# Patient Record
Sex: Female | Born: 1980 | Race: White | Hispanic: No | Marital: Married | Smoking: Never smoker
Health system: Southern US, Community
[De-identification: ages and names within clinical notes are randomized; demographics above are authoritative.]

## PROBLEM LIST (undated history)

## (undated) DIAGNOSIS — K219 Gastro-esophageal reflux disease without esophagitis: Secondary | ICD-10-CM

## (undated) DIAGNOSIS — D649 Anemia, unspecified: Secondary | ICD-10-CM

## (undated) DIAGNOSIS — Z5189 Encounter for other specified aftercare: Secondary | ICD-10-CM

## (undated) DIAGNOSIS — O26649 Intrahepatic cholestasis of pregnancy, unspecified trimester: Secondary | ICD-10-CM

## (undated) DIAGNOSIS — N809 Endometriosis, unspecified: Secondary | ICD-10-CM

## (undated) DIAGNOSIS — F411 Generalized anxiety disorder: Secondary | ICD-10-CM

## (undated) HISTORY — DX: Generalized anxiety disorder: F41.1

## (undated) HISTORY — PX: ABDOMINAL SURGERY: SHX537

## (undated) HISTORY — DX: Gastro-esophageal reflux disease without esophagitis: K21.9

## (undated) HISTORY — DX: Anemia, unspecified: D64.9

## (undated) HISTORY — DX: Endometriosis, unspecified: N80.9

---
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
## 1999-10-23 ENCOUNTER — Emergency Department (HOSPITAL_COMMUNITY): Admission: EM | Admit: 1999-10-23 | Discharge: 1999-10-23 | Payer: Self-pay

## 2006-09-21 ENCOUNTER — Ambulatory Visit (HOSPITAL_COMMUNITY): Admission: RE | Admit: 2006-09-21 | Discharge: 2006-09-21 | Payer: Self-pay | Admitting: *Deleted

## 2006-09-21 ENCOUNTER — Encounter (INDEPENDENT_AMBULATORY_CARE_PROVIDER_SITE_OTHER): Payer: Self-pay | Admitting: Specialist

## 2009-02-03 ENCOUNTER — Encounter: Admission: RE | Admit: 2009-02-03 | Discharge: 2009-02-03 | Payer: Self-pay | Admitting: Surgery

## 2010-12-09 NOTE — Op Note (Signed)
NAMEELON, LOMELI                ACCOUNT NO.:  [id number]   MEDICAL RECORD NO.:  [id number]          PATIENT TYPE:  AMB   LOCATION:  DAY                          FACILITY:  [facility]   PHYSICIAN:  Alfonse Ras, MD   DATE OF BIRTH:  01-27-1981   DATE OF PROCEDURE:  09/21/2006  DATE OF DISCHARGE:                               OPERATIVE REPORT   PREOPERATIVE DIAGNOSIS:  Right lower quadrant abdominal wall mass.   POSTOPERATIVE DIAGNOSIS:  Right lower quadrant abdominal wall mass.   PROCEDURE:  Excisional biopsy of right lower quadrant abdominal wall  mass.   SURGEON:  Alfonse Ras, MD   ANESTHESIA:  General.   DESCRIPTION:  The patient was taken to the operating room and placed in  the supine position after adequate anesthesia was induced.  Using  laryngeal mask, the right lower quadrant was prepped and draped in a  normal sterile fashion.  Using an oblique incision overlying the mass.  I dissected down through the subcutaneous tissue, on a very thin  patient.  The mass was mobilized off the musculature; however, the  external oblique fascia was divided.  After excising the mass in its  entirety.  The external oblique fascia was closed with interrupted #1  Novofil's.   Skin was closed with subcuticular 4-0 Monocryl.  Steri-Strips and  sterile dressings were applied.  The patient tolerated the procedure  well; and went to PACU in good condition.  Of note, the tissues were  injected with 0.5% Marcaine at the conclusion of the operation.      Alfonse Ras, MD  Electronically Signed     KRE/MEDQ  D:  09/21/2006  T:  09/21/2006  Job:  [id number]

## 2011-06-26 ENCOUNTER — Emergency Department (HOSPITAL_BASED_OUTPATIENT_CLINIC_OR_DEPARTMENT_OTHER)
Admission: EM | Admit: 2011-06-26 | Discharge: 2011-06-26 | Disposition: A | Discharge status: Home / Self Care | Payer: Self-pay | Attending: Emergency Medicine | Admitting: Emergency Medicine

## 2011-06-26 ENCOUNTER — Emergency Department (INDEPENDENT_AMBULATORY_CARE_PROVIDER_SITE_OTHER): Payer: Self-pay

## 2011-06-26 ENCOUNTER — Encounter: Payer: Self-pay | Admitting: *Deleted

## 2011-06-26 DIAGNOSIS — R1900 Intra-abdominal and pelvic swelling, mass and lump, unspecified site: Secondary | ICD-10-CM | POA: Insufficient documentation

## 2011-06-26 DIAGNOSIS — R109 Unspecified abdominal pain: Secondary | ICD-10-CM

## 2011-06-26 DIAGNOSIS — R222 Localized swelling, mass and lump, trunk: Secondary | ICD-10-CM

## 2011-06-26 DIAGNOSIS — K319 Disease of stomach and duodenum, unspecified: Secondary | ICD-10-CM

## 2011-06-26 DIAGNOSIS — R209 Unspecified disturbances of skin sensation: Secondary | ICD-10-CM | POA: Insufficient documentation

## 2011-06-26 DIAGNOSIS — N809 Endometriosis, unspecified: Secondary | ICD-10-CM

## 2011-06-26 LAB — CBC
HCT: 33.9 % — ABNORMAL LOW (ref 36.0–46.0)
Hemoglobin: 11.5 g/dL — ABNORMAL LOW (ref 12.0–15.0)
MCH: 30.3 pg (ref 26.0–34.0)
MCHC: 33.9 g/dL (ref 30.0–36.0)
MCV: 89.2 fL (ref 78.0–100.0)
Platelets: 207 10*3/uL (ref 150–400)
RBC: 3.8 MIL/uL — ABNORMAL LOW (ref 3.87–5.11)
RDW: 12.2 % (ref 11.5–15.5)
WBC: 7.3 10*3/uL (ref 4.0–10.5)

## 2011-06-26 LAB — DIFFERENTIAL
Basophils Absolute: 0 10*3/uL (ref 0.0–0.1)
Basophils Relative: 0 % (ref 0–1)
Eosinophils Absolute: 0 10*3/uL (ref 0.0–0.7)
Eosinophils Relative: 0 % (ref 0–5)
Lymphocytes Relative: 17 % (ref 12–46)
Lymphs Abs: 1.2 10*3/uL (ref 0.7–4.0)
Monocytes Absolute: 0.4 10*3/uL (ref 0.1–1.0)
Monocytes Relative: 5 % (ref 3–12)
Neutro Abs: 5.7 10*3/uL (ref 1.7–7.7)
Neutrophils Relative %: 78 % — ABNORMAL HIGH (ref 43–77)

## 2011-06-26 LAB — URINALYSIS, ROUTINE W REFLEX MICROSCOPIC
Bilirubin Urine: NEGATIVE
Glucose, UA: NEGATIVE mg/dL
Hgb urine dipstick: NEGATIVE
Ketones, ur: NEGATIVE mg/dL
Nitrite: NEGATIVE
Protein, ur: NEGATIVE mg/dL
Specific Gravity, Urine: 1.021 (ref 1.005–1.030)
Urobilinogen, UA: 1 mg/dL (ref 0.0–1.0)
pH: 6 (ref 5.0–8.0)

## 2011-06-26 LAB — BASIC METABOLIC PANEL
BUN: 12 mg/dL (ref 6–23)
CO2: 26 mEq/L (ref 19–32)
Calcium: 9.5 mg/dL (ref 8.4–10.5)
Chloride: 106 mEq/L (ref 96–112)
Creatinine, Ser: 0.6 mg/dL (ref 0.50–1.10)
GFR calc Af Amer: >90 mL/min (ref >90)
GFR calc non Af Amer: >90 mL/min (ref >90)
Glucose, Bld: 102 mg/dL — ABNORMAL HIGH (ref 70–99)
Potassium: 3.8 mEq/L (ref 3.5–5.1)
Sodium: 140 mEq/L (ref 135–145)

## 2011-06-26 LAB — URINE MICROSCOPIC-ADD ON: RBC / HPF: NONE SEEN RBC/hpf (ref <3)

## 2011-06-26 LAB — PREGNANCY, URINE: Preg Test, Ur: NEGATIVE

## 2011-06-26 MED ORDER — HYDROCODONE-ACETAMINOPHEN 5-325 MG PO TABS: 2.0000 | ORAL_TABLET | ORAL | Status: AC | PRN

## 2011-06-26 NOTE — ED Notes (Signed)
Pt taken to US

## 2011-06-26 NOTE — ED Provider Notes (Signed)
History     CSN: [id number] Arrival date & time: 06/26/2011 12:56 PM   First MD Initiated Contact with Patient 06/26/11 1355      Chief Complaint  Patient presents with  . Abdominal Pain    (Consider location/radiation/quality/duration/timing/severity/associated sxs/prior treatment) HPI Abdominal pain began one week ago.  S/p tumor removal (fatty mass) patient states it was just under skin and told it was benign- removed by Dr. Simona Huh.  Second tumor diagnosed but decided not to remove.  Pain is in that area.  Pain increases with palpation, coughing, movement.  Patient is off bcp for three months.  LMP 2 weeks ago normal.  No change in bowel, no nausea or vomiting.  Patient has had increased recent physical acitivity.   History reviewed. No pertinent past medical history.  Past Surgical History  Procedure Date  . Abdominal surgery     History reviewed. No pertinent family history.  History  Substance Use Topics  . Smoking status: Never Smoker   . Smokeless tobacco: Not on file  . Alcohol Use: No    OB History    Grav Para Term Preterm Abortions TAB SAB Ect Mult Living                  Review of Systems  All other systems reviewed and are negative.    Allergies  Review of patient's allergies indicates not on file.  Home Medications  No current outpatient prescriptions on file.  BP 122/70  Pulse 67  Temp 98.9 F (37.2 C)  Resp 16  Ht 5\' 1"  (1.549 m)  Wt 98 lb (44.453 kg)  BMI 18.52 kg/m2  SpO2 100%  LMP 06/24/2011  Physical Exam  Nursing note and vitals reviewed. Constitutional: She is oriented to person, place, and time. She appears well-developed and well-nourished.  HENT:  Head: Normocephalic and atraumatic.  Eyes: Conjunctivae and EOM are normal. Pupils are equal, round, and reactive to light.  Neck: Normal range of motion. Neck supple.  Cardiovascular: Normal rate, regular rhythm, normal heart sounds and intact distal pulses.   Pulmonary/Chest:  Effort normal and breath sounds normal.  Abdominal: Soft. Bowel sounds are normal. There is tenderness.       Tenderness suprapubic area  Musculoskeletal: Normal range of motion.  Neurological: She is alert and oriented to person, place, and time.  Skin: Skin is warm and dry.  Psychiatric: She has a normal mood and affect. Her behavior is normal. Judgment and thought content normal.    ED Course  Procedures (including critical care time)  Labs Reviewed  URINALYSIS, ROUTINE W REFLEX MICROSCOPIC - Abnormal; Notable for the following:    APPearance CLOUDY (*)    Leukocytes, UA TRACE (*)    All other components within normal limits  URINE MICROSCOPIC-ADD ON - Abnormal; Notable for the following:    Squamous Epithelial / LPF FEW (*)    Bacteria, UA MANY (*)    All other components within normal limits  PREGNANCY, URINE   No results found.   No diagnosis found.    Patient to have ultrasound and will be referred back to general surgery if nonacute.          Hilario Quarry, MD 06/26/11 [phone]

## 2011-06-26 NOTE — ED Notes (Signed)
I assumed care of this patient from Dr. Rosalia Hammers. Her abdominal ultrasound shows the anterior abdominal wall mass lesion has increased in size  since last measured in 2010.  Patient is stable in no distress. Abdomen is soft with minimal suprapubic tenderness. Referred to surgery for elective repair as an outpatient.  Glynn Octave, MD 06/26/11 [phone]

## 2011-06-26 NOTE — ED Notes (Signed)
Pt c/o lower right abd pain x 1 week , denies n/v/d

## 2011-06-26 NOTE — ED Notes (Signed)
Pt returned from US

## 2011-07-13 ENCOUNTER — Encounter (INDEPENDENT_AMBULATORY_CARE_PROVIDER_SITE_OTHER): Payer: Self-pay | Admitting: Surgery

## 2011-08-21 ENCOUNTER — Encounter (INDEPENDENT_AMBULATORY_CARE_PROVIDER_SITE_OTHER): Payer: Self-pay | Admitting: Surgery

## 2014-12-09 ENCOUNTER — Other Ambulatory Visit (HOSPITAL_COMMUNITY): Payer: Self-pay | Admitting: Gynecology

## 2014-12-09 DIAGNOSIS — Z3141 Encounter for fertility testing: Secondary | ICD-10-CM

## 2014-12-15 ENCOUNTER — Ambulatory Visit (HOSPITAL_COMMUNITY)
Admission: RE | Admit: 2014-12-15 | Discharge: 2014-12-15 | Disposition: A | Discharge status: Home / Self Care | Payer: BC Managed Care – PPO | Source: Ambulatory Visit | Attending: Gynecology | Admitting: Gynecology

## 2014-12-15 DIAGNOSIS — N979 Female infertility, unspecified: Secondary | ICD-10-CM | POA: Diagnosis not present

## 2014-12-15 DIAGNOSIS — Z3141 Encounter for fertility testing: Secondary | ICD-10-CM

## 2014-12-15 MED ORDER — IOHEXOL 300 MG/ML  SOLN
30.0000 mL | Freq: Once | INTRAMUSCULAR | Status: AC | PRN
  Administered 2014-12-15: 30 mL

## 2015-03-01 ENCOUNTER — Encounter (HOSPITAL_COMMUNITY): Payer: Self-pay | Admitting: Emergency Medicine

## 2015-03-01 ENCOUNTER — Emergency Department (HOSPITAL_COMMUNITY)
Admission: EM | Admit: 2015-03-01 | Discharge: 2015-03-01 | Disposition: A | Discharge status: Home / Self Care | Payer: BC Managed Care – PPO | Attending: Emergency Medicine | Admitting: Emergency Medicine

## 2015-03-01 ENCOUNTER — Emergency Department (HOSPITAL_COMMUNITY): Payer: BC Managed Care – PPO

## 2015-03-01 ENCOUNTER — Emergency Department (HOSPITAL_BASED_OUTPATIENT_CLINIC_OR_DEPARTMENT_OTHER)
Admit: 2015-03-01 | Discharge: 2015-03-01 | Disposition: A | Discharge status: Home / Self Care | Payer: BC Managed Care – PPO | Attending: Emergency Medicine | Admitting: Emergency Medicine

## 2015-03-01 DIAGNOSIS — Z9889 Other specified postprocedural states: Secondary | ICD-10-CM

## 2015-03-01 DIAGNOSIS — R0600 Dyspnea, unspecified: Secondary | ICD-10-CM | POA: Insufficient documentation

## 2015-03-01 DIAGNOSIS — M79609 Pain in unspecified limb: Secondary | ICD-10-CM | POA: Diagnosis not present

## 2015-03-01 DIAGNOSIS — E876 Hypokalemia: Secondary | ICD-10-CM | POA: Diagnosis not present

## 2015-03-01 DIAGNOSIS — R0602 Shortness of breath: Secondary | ICD-10-CM | POA: Insufficient documentation

## 2015-03-01 DIAGNOSIS — R2243 Localized swelling, mass and lump, lower limb, bilateral: Secondary | ICD-10-CM | POA: Diagnosis present

## 2015-03-01 DIAGNOSIS — Z79899 Other long term (current) drug therapy: Secondary | ICD-10-CM | POA: Diagnosis not present

## 2015-03-01 DIAGNOSIS — M79606 Pain in leg, unspecified: Secondary | ICD-10-CM

## 2015-03-01 DIAGNOSIS — Z792 Long term (current) use of antibiotics: Secondary | ICD-10-CM | POA: Insufficient documentation

## 2015-03-01 DIAGNOSIS — M7989 Other specified soft tissue disorders: Secondary | ICD-10-CM

## 2015-03-01 DIAGNOSIS — R079 Chest pain, unspecified: Secondary | ICD-10-CM | POA: Insufficient documentation

## 2015-03-01 LAB — PROTIME-INR
INR: 1 (ref 0.00–1.49)
Prothrombin Time: 13.4 seconds (ref 11.6–15.2)

## 2015-03-01 LAB — URINALYSIS, ROUTINE W REFLEX MICROSCOPIC
Bilirubin Urine: NEGATIVE
Glucose, UA: NEGATIVE mg/dL
Hgb urine dipstick: NEGATIVE
Ketones, ur: NEGATIVE mg/dL
Leukocytes, UA: NEGATIVE
Nitrite: NEGATIVE
Protein, ur: NEGATIVE mg/dL
Specific Gravity, Urine: 1.027 (ref 1.005–1.030)
Urobilinogen, UA: 1 mg/dL (ref 0.0–1.0)
pH: 6.5 (ref 5.0–8.0)

## 2015-03-01 LAB — BASIC METABOLIC PANEL
Anion gap: 8 (ref 5–15)
BUN: 15 mg/dL (ref 6–20)
CO2: 25 mmol/L (ref 22–32)
Calcium: 9.5 mg/dL (ref 8.9–10.3)
Chloride: 105 mmol/L (ref 101–111)
Creatinine, Ser: 0.79 mg/dL (ref 0.44–1.00)
GFR calc Af Amer: >60 mL/min (ref >60)
GFR calc non Af Amer: >60 mL/min (ref >60)
Glucose, Bld: 119 mg/dL — ABNORMAL HIGH (ref 65–99)
Potassium: 3.4 mmol/L — ABNORMAL LOW (ref 3.5–5.1)
Sodium: 138 mmol/L (ref 135–145)

## 2015-03-01 LAB — CBC
HCT: 38.9 % (ref 36.0–46.0)
Hemoglobin: 13.1 g/dL (ref 12.0–15.0)
MCH: 30.6 pg (ref 26.0–34.0)
MCHC: 33.7 g/dL (ref 30.0–36.0)
MCV: 90.9 fL (ref 78.0–100.0)
Platelets: 274 10*3/uL (ref 150–400)
RBC: 4.28 MIL/uL (ref 3.87–5.11)
RDW: 12.4 % (ref 11.5–15.5)
WBC: 10.7 10*3/uL — ABNORMAL HIGH (ref 4.0–10.5)

## 2015-03-01 LAB — D-DIMER, QUANTITATIVE: D-Dimer, Quant: 1.35 ug/mL-FEU — ABNORMAL HIGH (ref 0.00–0.48)

## 2015-03-01 LAB — I-STAT TROPONIN, ED: Troponin i, poc: 0 ng/mL (ref 0.00–0.08)

## 2015-03-01 MED ORDER — IOHEXOL 350 MG/ML SOLN
100.0000 mL | Freq: Once | INTRAVENOUS | Status: AC | PRN
  Administered 2015-03-01: 80 mL via INTRAVENOUS

## 2015-03-01 NOTE — Progress Notes (Signed)
Bilateral lower extremity venous duplex completed:  No evidence of DVT, superficial thrombosis, or Baker's cyst.   

## 2015-03-01 NOTE — ED Provider Notes (Signed)
CSN: [id number]     Arrival date & time 03/01/15  1508 History   First MD Initiated Contact with Patient 03/01/15 1534     Chief Complaint  Patient presents with  . Shortness of Breath  . Chest Pain  . Leg Swelling     (Consider location/radiation/quality/duration/timing/severity/associated sxs/prior Treatment) HPI Laproscopic abdominal surgery for endometriosis 10 days ago. 2 days ago started to get tight, aching feeling in calves. Also noted feeling breathless today. No pleuritic CP. No fever or cough but chills. No vomiting or change in BM. No GU symptoms. No post op abdominal or back pain. Patient contacted her doctor regarding symptoms and referred to ED to evaluated for blood clot. History reviewed. No pertinent past medical history. Past Surgical History  Procedure Laterality Date  . Abdominal surgery     History reviewed. No pertinent family history. History  Substance Use Topics  . Smoking status: Never Smoker   . Smokeless tobacco: Not on file  . Alcohol Use: No   OB History    No data available     Review of Systems 10 Systems reviewed and are negative for acute change except as noted in the HPI.   Allergies  Review of patient's allergies indicates no known allergies.  Home Medications   Prior to Admission medications   Medication Sig Start Date End Date Taking? Authorizing Provider  doxycycline (VIBRAMYCIN) 100 MG capsule Take 1 capsule by mouth 2 (two) times daily. 02/16/15 03/01/15 Yes Historical Provider, MD  Prenatal Vit-Fe Fumarate-FA (PRENATAL MULTIVITAMIN) TABS tablet Take 2 tablets by mouth daily at 12 noon.   Yes Historical Provider, MD   BP 138/76 mmHg  Pulse 70  Temp(Src) 98.2 F (36.8 C) (Oral)  Resp 20  SpO2 100%  LMP 02/11/2015 (Exact Date) Physical Exam  Constitutional: She is oriented to person, place, and time. She appears well-developed and well-nourished.  HENT:  Head: Normocephalic and atraumatic.  Eyes: EOM are normal. Pupils are  equal, round, and reactive to light.  Neck: Neck supple.  Cardiovascular: Normal rate, regular rhythm, normal heart sounds and intact distal pulses.   Pulmonary/Chest: Effort normal and breath sounds normal.  Abdominal: Soft. Bowel sounds are normal. She exhibits no distension. There is no tenderness.  Musculoskeletal: Normal range of motion. She exhibits no edema or tenderness.  Neurological: She is alert and oriented to person, place, and time. She has normal strength. Coordination normal. GCS eye subscore is 4. GCS verbal subscore is 5. GCS motor subscore is 6.  Skin: Skin is warm, dry and intact.  Psychiatric: She has a normal mood and affect.    ED Course  Procedures (including critical care time) Labs Review Labs Reviewed  BASIC METABOLIC PANEL - Abnormal; Notable for the following:    Potassium 3.4 (*)    Glucose, Bld 119 (*)    All other components within normal limits  CBC - Abnormal; Notable for the following:    WBC 10.7 (*)    All other components within normal limits  D-DIMER, QUANTITATIVE (NOT AT [facility]) - Abnormal; Notable for the following:    D-Dimer, Quant 1.35 (*)    All other components within normal limits  PROTIME-INR  URINALYSIS, ROUTINE W REFLEX MICROSCOPIC (NOT AT [facility])  I-STAT TROPOININ, ED    Imaging Review Ct Angio Chest Pe W/cm &/or Wo Cm  03/01/2015   CLINICAL DATA:  34 year old female with recent laparoscopic abdominal surgery presenting with bilateral calf pain and swelling. Tightness in the chest during deep  inspiration.  EXAM: CT ANGIOGRAPHY CHEST WITH CONTRAST  TECHNIQUE: Multidetector CT imaging of the chest was performed using the standard protocol during bolus administration of intravenous contrast. Multiplanar CT image reconstructions and MIPs were obtained to evaluate the vascular anatomy.  CONTRAST:  80mL OMNIPAQUE IOHEXOL 350 MG/ML SOLN  COMPARISON:  No priors.  FINDINGS: Mediastinum/Lymph Nodes: No filling defects within the pulmonary arterial  tree to suggest underlying pulmonary embolism. Heart size is normal. There is no significant pericardial fluid, thickening or pericardial calcification. No pathologically enlarged mediastinal or hilar lymph nodes. Esophagus is unremarkable in appearance. No axillary lymphadenopathy.  Lungs/Pleura: No acute consolidative airspace disease. No pleural effusions. No suspicious appearing pulmonary nodules or masses.  Upper Abdomen: Unremarkable.  Musculoskeletal/Soft Tissues: There are no aggressive appearing lytic or blastic lesions noted in the visualized portions of the skeleton.  Review of the MIP images confirms the above findings.  IMPRESSION: 1. No evidence pulmonary embolism. 2. No acute findings in the thorax to account for the patient's symptoms.   Electronically Signed   By: Trudie Reed M.D.   On: 03/01/2015 18:22     EKG Interpretation   Date/Time:  Monday March 01 2015 15:29:54 EDT Ventricular Rate:  69 PR Interval:  130 QRS Duration: 88 QT Interval:  393 QTC Calculation: 421 R Axis:   79 Text Interpretation:  Sinus rhythm Baseline wander in lead(s) V2 V6 no  ischemia, normal. Confirmed by Donnald Garre, MD, Lebron Conners [id number]) on 03/01/2015  5:22:42 PM      MDM   Final diagnoses:  Dyspnea  Pain of lower extremity, unspecified laterality  Status post laparoscopy  Hypokalemia   Patient presents with symptoms of lower extremity cramping and discomfort in her calves. She also noted dyspnea. The patient is status post laparoscopy and was sent to the emergency department for concern of possible PE. Evaluation does not show DVT or PE. Her lower extremity examination is normal at this time. She however expresses swelling and then discomfort with walking that is burning in quality. The patient may be experiencing some dependent edema however she is a physically well conditioned young individual without medical comorbidity. Patient does not endorse any back pain, she denies any associated  abdominal pain status post her laparoscopy and does not have any neurologic weakness. At this time there is not suspicion for a back related etiology. The patient is counseled however the necessity return if there should be any development of weakness of the lower extremities or development of back pain. At this time I do feel patient is safe for continued outpatient management with her primary physician. She does have very mild hypokalemia and is instructed on oral replacement.    Arby Barrette, MD 03/01/15 [phone]

## 2015-03-01 NOTE — ED Notes (Signed)
Bed: ZO10 Expected date:  Expected time:  Means of arrival:  Comments: Long

## 2015-03-01 NOTE — Discharge Instructions (Signed)
Edema °Edema is an abnormal buildup of fluids in your body tissues. Edema is somewhat dependent on gravity to pull the fluid to the lowest place in your body. That makes the condition more common in the legs and thighs (lower extremities). Painless swelling of the feet and ankles is common and becomes more likely as you get older. It is also common in looser tissues, like around your eyes.  °When the affected area is squeezed, the fluid may move out of that spot and leave a dent for a few moments. This dent is called pitting.  °CAUSES  °There are many possible causes of edema. Eating too much salt and being on your feet or sitting for a long time can cause edema in your legs and ankles. Hot weather may make edema worse. Common medical causes of edema include: °· Heart failure. °· Liver disease. °· Kidney disease. °· Weak blood vessels in your legs. °· Cancer. °· An injury. °· Pregnancy. °· Some medications. °· Obesity.  °SYMPTOMS  °Edema is usually painless. Your skin may look swollen or shiny.  °DIAGNOSIS  °Your health care provider may be able to diagnose edema by asking about your medical history and doing a physical exam. You may need to have tests such as X-rays, an electrocardiogram, or blood tests to check for medical conditions that may cause edema.  °TREATMENT  °Edema treatment depends on the cause. If you have heart, liver, or kidney disease, you need the treatment appropriate for these conditions. General treatment may include: °· Elevation of the affected body part above the level of your heart. °· Compression of the affected body part. Pressure from elastic bandages or support stockings squeezes the tissues and forces fluid back into the blood vessels. This keeps fluid from entering the tissues. °· Restriction of fluid and salt intake. °· Use of a water pill (diuretic). These medications are appropriate only for some types of edema. They pull fluid out of your body and make you urinate more often. This  gets rid of fluid and reduces swelling, but diuretics can have side effects. Only use diuretics as directed by your health care provider. °HOME CARE INSTRUCTIONS  °· Keep the affected body part above the level of your heart when you are lying down.   °· Do not sit still or stand for prolonged periods.   °· Do not put anything directly under your knees when lying down. °· Do not wear constricting clothing or garters on your upper legs.   °· Exercise your legs to work the fluid back into your blood vessels. This may help the swelling go down.   °· Wear elastic bandages or support stockings to reduce ankle swelling as directed by your health care provider.   °· Eat a low-salt diet to reduce fluid if your health care provider recommends it.   °· Only take medicines as directed by your health care provider.  °SEEK MEDICAL CARE IF:  °· Your edema is not responding to treatment. °· You have heart, liver, or kidney disease and notice symptoms of edema. °· You have edema in your legs that does not improve after elevating them.   °· You have sudden and unexplained weight gain. °SEEK IMMEDIATE MEDICAL CARE IF:  °· You develop shortness of breath or chest pain.   °· You cannot breathe when you lie down. °· You develop pain, redness, or warmth in the swollen areas.   °· You have heart, liver, or kidney disease and suddenly get edema. °· You have a fever and your symptoms suddenly get worse. °MAKE SURE YOU:  °·   Understand these instructions. °· Will watch your condition. °· Will get help right away if you are not doing well or get worse. °Document Released: 07/10/2005 Document Revised: 11/24/2013 Document Reviewed: 05/02/2013 °ExitCare® Patient Information ©2015 [employer]. This information is not intended to replace advice given to you by your health care provider. Make sure you discuss any questions you have with your health care provider. ° °Shortness of Breath °Shortness of breath means you have trouble breathing. It could  also mean that you have a medical problem. You should get immediate medical care for shortness of breath. °CAUSES  °· Not enough oxygen in the air such as with high altitudes or a smoke-filled room. °· Certain lung diseases, infections, or problems. °· Heart disease or conditions, such as angina or heart failure. °· Low red blood cells (anemia). °· Poor physical fitness, which can cause shortness of breath when you exercise. °· Chest or back injuries or stiffness. °· Being overweight. °· Smoking. °· Anxiety, which can make you feel like you are not getting enough air. °DIAGNOSIS  °Serious medical problems can often be found during your physical exam. Tests may also be done to determine why you are having shortness of breath. Tests may include: °· Chest X-rays. °· Lung function tests. °· Blood tests. °· An electrocardiogram (ECG). °· An ambulatory electrocardiogram. An ambulatory ECG records your heartbeat patterns over a 24-hour period. °· Exercise testing. °· A transthoracic echocardiogram (TTE). During echocardiography, sound waves are used to evaluate how blood flows through your heart. °· A transesophageal echocardiogram (TEE). °· Imaging scans. °Your health care provider may not be able to find a cause for your shortness of breath after your exam. In this case, it is important to have a follow-up exam with your health care provider as directed.  °TREATMENT  °Treatment for shortness of breath depends on the cause of your symptoms and can vary greatly. °HOME CARE INSTRUCTIONS  °· Do not smoke. Smoking is a common cause of shortness of breath. If you smoke, ask for help to quit. °· Avoid being around chemicals or things that may bother your breathing, such as paint fumes and dust. °· Rest as needed. Slowly resume your usual activities. °· If medicines were prescribed, take them as directed for the full length of time directed. This includes oxygen and any inhaled medicines. °· Keep all follow-up appointments as  directed by your health care provider. °SEEK MEDICAL CARE IF:  °· Your condition does not improve in the time expected. °· You have a hard time doing your normal activities even with rest. °· You have any new symptoms. °SEEK IMMEDIATE MEDICAL CARE IF:  °· Your shortness of breath gets worse. °· You feel light-headed, faint, or develop a cough not controlled with medicines. °· You start coughing up blood. °· You have pain with breathing. °· You have chest pain or pain in your arms, shoulders, or abdomen. °· You have a fever. °· You are unable to walk up stairs or exercise the way you normally do. °MAKE SURE YOU: °· Understand these instructions. °· Will watch your condition. °· Will get help right away if you are not doing well or get worse. °Document Released: 04/04/2001 Document Revised: 07/15/2013 Document Reviewed: 09/25/2011 °ExitCare® Patient Information ©2015 [employer]. This information is not intended to replace advice given to you by your health care provider. Make sure you discuss any questions you have with your health care provider. ° °

## 2015-03-01 NOTE — ED Notes (Signed)
Pt reports having recent abdominal laprascopic surgery. Started having bilateral calf swelling a few days ago with "burning" in calves when she walks. Also says she feels like there is "tightness when I breathe, like I can't take a deep breath." Spoke with [facility] in [place] and was referred here to "have aspirin as soon as possible" and [place] for possible blood clots. Denies CP/SOB. Ambulatory with steady gait. No other c/c.

## 2018-01-16 ENCOUNTER — Encounter: Payer: Self-pay | Admitting: Family Medicine

## 2018-01-16 ENCOUNTER — Ambulatory Visit (INDEPENDENT_AMBULATORY_CARE_PROVIDER_SITE_OTHER): Payer: BLUE CROSS/BLUE SHIELD | Admitting: Family Medicine

## 2018-01-16 VITALS — BP 102/62 | HR 83 | Temp 98.8°F | Ht 61.0 in | Wt 94.2 lb

## 2018-01-16 DIAGNOSIS — F411 Generalized anxiety disorder: Secondary | ICD-10-CM

## 2018-01-16 NOTE — Progress Notes (Signed)
Pre visit review using our clinic review tool, if applicable. No additional management support is needed unless otherwise documented below in the visit note. 

## 2018-01-16 NOTE — Patient Instructions (Addendum)
Medications I would consider: 1- Zoloft (sertraline) 2- Lexapro (escitalopram) 3- Prozac (fluoxetine)  Coping skills Choose 5 that work for you:  Take a deep breath  Count to 20  Read a book  Do a puzzle  Meditate  Bake  Sing  Knit  Garden  Pray  Go outside  Call a friend  Listen to music  Take a walk  Color  Send a note  Take a bath  Watch a movie  Be alone in a quiet place  Pet an animal  Visit a friend  Journal  Exercise  Stretch

## 2018-01-16 NOTE — Progress Notes (Signed)
Chief Complaint  Patient presents with  . New Patient (Initial Visit)       New Patient Visit SUBJECTIVE: HPI: Melinda Nichols is an 37 y.o.female who is being seen for establishing care.  The patient has not had a primary care provider many years.  She reports a history of anxiety.  Over the past couple months, she went through a separation and things have gotten worse.  She will wake up in the early morning hours with her heart fast and her mind racing.  Her overall levels of anxiety are higher as well.  She is not on any current medication.  Over the past 2 months, she has been seeing a Veterinary surgeon, which has been helpful..  Denies thoughts of harming herself or others.  No self-medication.  She does have a family history of bipolar disease.  Her father got addicted to Xanax and she wishes to avoid that if possible.  She does exercise and go to the gym which helps.  No Known Allergies  Past Medical History:  Diagnosis Date  . GAD (generalized anxiety disorder)    Past Surgical History:  Procedure Laterality Date  . ABDOMINAL SURGERY    . CESAREAN SECTION  2003   Family History  Problem Relation Age of Onset  . Bipolar disorder Mother   . Bipolar disorder Father   . Cancer Sister        endometrial cancer  . Bipolar disorder Brother   . Heart disease Daughter   . Asthma Daughter    No Known Allergies  Takes no medications routinely.  ROS Cardiovascular: Denies current palpitations  Psych: As noted in HPI   OBJECTIVE: BP 102/62 (BP Location: Left Arm, Patient Position: Sitting, Cuff Size: Normal)   Pulse 83   Temp 98.8 F (37.1 C) (Oral)   Ht 5\' 1"  (1.549 m)   Wt 94 lb 4 oz (42.8 kg)   SpO2 99%   BMI 17.81 kg/m   Constitutional: -  VS reviewed -  Well developed, well nourished, appears stated age -  No apparent distress  Psychiatric: -  Oriented to person, place, and time -  Memory intact -  Affect and mood normal -  Fluent conversation, good eye contact -   Judgment and insight age appropriate  Eye: -  Conjunctivae clear, no discharge -  Pupils symmetric, round, reactive to light  ENMT: -  MMM    Pharynx moist, no exudate, no erythema  Neck: -  No gross swelling, no palpable masses -  Thyroid midline, not enlarged, mobile, no palpable masses  Cardiovascular: -  RRR -  No LE edema  Respiratory: -  Normal respiratory effort, no accessory muscle use, no retraction -  Breath sounds equal, no wheezes, no ronchi, no crackles  Neurological:  -  CN II - XII grossly intact -  DTR's equal and symmetric  Musculoskeletal: -  No clubbing, no cyanosis -  Gait normal  Skin: -  No significant lesion on inspection -  Warm and dry to palpation   ASSESSMENT/PLAN: GAD (generalized anxiety disorder)  Continue with counseling.  Coping strategies provided in her AVS.  I also gave her a list of 3 SSRIs (Zoloft, Lexapro, and Prozac) I will consider starting should she decide on the medication.  If he does decide on this, she will let us know and follow 6-week follow-up from that point. Patient should return pending request. The patient voiced understanding and agreement to the plan.   Jilda Roche  Dauntae Derusha, DO 01/16/18  11:55 AM

## 2018-03-06 ENCOUNTER — Ambulatory Visit: Payer: Self-pay

## 2018-03-06 ENCOUNTER — Ambulatory Visit (INDEPENDENT_AMBULATORY_CARE_PROVIDER_SITE_OTHER): Payer: BLUE CROSS/BLUE SHIELD | Admitting: Family Medicine

## 2018-03-06 ENCOUNTER — Encounter: Payer: Self-pay | Admitting: Family Medicine

## 2018-03-06 VITALS — BP 102/68 | HR 52 | Temp 98.0°F | Ht 61.0 in | Wt 97.5 lb

## 2018-03-06 DIAGNOSIS — F411 Generalized anxiety disorder: Secondary | ICD-10-CM

## 2018-03-06 DIAGNOSIS — L989 Disorder of the skin and subcutaneous tissue, unspecified: Secondary | ICD-10-CM

## 2018-03-06 MED ORDER — SERTRALINE HCL 50 MG PO TABS: 50.0000 mg | ORAL_TABLET | Freq: Every day | ORAL | 3 refills | Status: DC

## 2018-03-06 MED ORDER — DOXYCYCLINE HYCLATE 100 MG PO TABS: 100.0000 mg | ORAL_TABLET | Freq: Two times a day (BID) | ORAL | 0 refills | Status: AC

## 2018-03-06 NOTE — Telephone Encounter (Signed)
Returned call to pt.  Reported she woke up on Tues. AM, and had a small bite mark on the underneath side of right upper arm.  Doesn't know what bit her.  Reported the site initially looked like a red area surrounded by a white ring, about the size of a dime.  Stated as the day progressed the area itched and burned, and the redness spread into surrounding tissue.  Stated she woke up today and the site has a red center with a white ring border, and has redness outside this area, to the diameter of an orange. Stated the area is warm and tender to touch.  Stated there is mild burning/ itching of the site.  Denied fever/ chills.  Reported she woke up with a nagging headache and nausea.  Stated she does not know if this is related to the bite.  Appt. scheduled for 10:45 AM today with PCP.  Care advice given per protocol. Verb. Understanding; agrees with plan.         Reason for Disposition . [1] Red or very tender (to touch) area AND [2] started over 24 hours after the bite  Answer Assessment - Initial Assessment Questions 1. TYPE of SPIDER: "What type of spider was it?"  (e.g., name, unknown, or brief description)     unknown 2. LOCATION: "Where is the bite located?"     Right upper arm; red, warm, tender area size of diameter of an orange  3. PAIN: "Is there any pain?" If so, ask: "How bad is it?"  (Scale 1-10; or mild, moderate, severe)     Burning pain is mild and constant  4. SWELLING: "How big is the swelling?" (Inches, cm or compare to coins)     Some swelling but not significant  5. ONSET: "When did the bite occur?" (Minutes or hours ago)      Late Monday night and Tuesday morning  6. TETANUS: "When was the last tetanus booster?"     Unknown; thinks its been a very long time  7. OTHER SYMPTOMS: "Do you have any other symptoms?"  (e.g., muscle cramps, abdominal pain, change in urine color)     Headache, nausea, denied fever/ chills  Protocols used: SPIDER BITE - [place]-A-AH

## 2018-03-06 NOTE — Progress Notes (Signed)
Chief Complaint  Patient presents with  . Insect Bite    Melinda Nichols is a 37 y.o. female here for a skin complaint.  Duration: 1 day Location: R inner upper arm Pruritic? No Painful? No Drainage? No New soaps/lotions/topicals/detergents? No Sick contacts? No Other associated symptoms: None Therapies tried thus far: None  She also never followed up regarding generalized anxiety.  She would like to start Zoloft.  ROS:  Const: No fevers Skin: As noted in HPI  Past Medical History:  Diagnosis Date  . GAD (generalized anxiety disorder)    No Known Allergies   BP 102/68 (BP Location: Left Arm, Patient Position: Sitting, Cuff Size: Normal)   Pulse (!) 52   Temp 98 F (36.7 C) (Oral)   Ht 5\' 1"  (1.549 m)   Wt 97 lb 8 oz (44.2 kg)   SpO2 98%   BMI 18.42 kg/m  Gen: awake, alert, appearing stated age Heart: RRR Lungs: CTAB. No accessory muscle use Skin: See below. No drainage, TTP, fluctuance, excoriation Psych: Age appropriate judgment and insight   R upper arm   Skin lesion - Plan: doxycycline (VIBRA-TABS) 100 MG tablet  GAD (generalized anxiety disorder) - Plan: sertraline (ZOLOFT) 50 MG tablet  Given target lesion, will treat empirically for Lyme disease with 21 days of doxycycline.  Warning about GI upset, proclivity for sunburn discussed. We will start Zoloft at half tab daily for 2 weeks and increase to full tab daily.  I would like her to start this after she finishes her antibiotic. F/u in 2 mo. The patient voiced understanding and agreement to the plan.  Jilda Rocheicholas Paul MatthewsWendling, DO 03/06/18 11:44 AM

## 2018-03-06 NOTE — Patient Instructions (Addendum)
Take medicine with food.   Make sure to wear sunscreen on this medication.  Don't take Zoloft until you are finished with doxycycline.  Things to look out for: increasing pain not relieved by ibuprofen/acetaminophen, fevers, spreading redness, drainage of pus, or foul odor.  Let us know if you need anything.

## 2018-03-06 NOTE — Progress Notes (Signed)
Pre visit review using our clinic review tool, if applicable. No additional management support is needed unless otherwise documented below in the visit note. 

## 2018-03-14 ENCOUNTER — Telehealth: Payer: Self-pay

## 2018-03-14 NOTE — Telephone Encounter (Signed)
Per notification from [facility] Dr. Carmelia RollerWendling states patient needs to make sure she takes medication with food and can be seen by him in office tomorrow.Called patient left message an answering machine. Advise to go to ED or UC if symptoms worsen.

## 2019-04-04 ENCOUNTER — Other Ambulatory Visit: Payer: Self-pay | Admitting: Family Medicine

## 2019-04-04 DIAGNOSIS — F411 Generalized anxiety disorder: Secondary | ICD-10-CM

## 2019-04-08 ENCOUNTER — Other Ambulatory Visit: Payer: Self-pay | Admitting: Family Medicine

## 2019-04-08 DIAGNOSIS — F411 Generalized anxiety disorder: Secondary | ICD-10-CM

## 2019-04-08 MED ORDER — SERTRALINE HCL 50 MG PO TABS: 50.0000 mg | ORAL_TABLET | Freq: Every day | ORAL | 0 refills | Status: DC

## 2019-04-08 NOTE — Telephone Encounter (Signed)
Requested medication (s) are due for refill today: yes  Requested medication (s) are on the active medication list: yes  Future visit scheduled: yes  Notes to clinic:  Patient is out of medication and and would like enough until her appointment   Requested Prescriptions  Pending Prescriptions Disp Refills   sertraline (ZOLOFT) 50 MG tablet 30 tablet 3    Sig: Take 1 tablet (50 mg total) by mouth daily. Take 1/2 tab daily for first 2 weeks.     Psychiatry:  Antidepressants - SSRI Failed - 04/08/2019  2:07 PM      Failed - Valid encounter within last 6 months    Recent Outpatient Visits          1 year ago Skin lesion   [facility], DO   1 year ago GAD (generalized anxiety disorder)   [facility], DO      Future Appointments            In 6 days Wendling, [place], [place] [facility], [facility]           Failed - Completed PHQ-2 or PHQ-9 in the last 360 days.

## 2019-04-08 NOTE — Telephone Encounter (Signed)
Pt called stating she is running out of her medication, sertraline, today. Pt scheduled her next appt, but is requesting a refill sent in to last her until her appt. Please advise.    [facility], [place] [address]  Phone: [phone] Fax: [phone]  Not a 24 hour pharmacy; exact hours not known.

## 2019-04-14 ENCOUNTER — Ambulatory Visit (INDEPENDENT_AMBULATORY_CARE_PROVIDER_SITE_OTHER): Payer: Self-pay | Admitting: Family Medicine

## 2019-04-14 ENCOUNTER — Encounter: Payer: Self-pay | Admitting: Family Medicine

## 2019-04-14 ENCOUNTER — Other Ambulatory Visit: Payer: Self-pay

## 2019-04-14 VITALS — BP 102/68 | HR 56 | Temp 98.0°F | Ht 61.0 in | Wt 102.1 lb

## 2019-04-14 DIAGNOSIS — F411 Generalized anxiety disorder: Secondary | ICD-10-CM

## 2019-04-14 DIAGNOSIS — R42 Dizziness and giddiness: Secondary | ICD-10-CM

## 2019-04-14 DIAGNOSIS — R001 Bradycardia, unspecified: Secondary | ICD-10-CM

## 2019-04-14 MED ORDER — SERTRALINE HCL 50 MG PO TABS: 50.0000 mg | ORAL_TABLET | Freq: Every day | ORAL | 2 refills | Status: DC

## 2019-04-14 NOTE — Patient Instructions (Addendum)
Sign up for MyChart.  Stay hydrated.  Let me know when your insurance situation is figured out.  Let us know if you need anything.

## 2019-04-14 NOTE — Progress Notes (Signed)
Chief Complaint  Patient presents with  . Follow-up    ran out of [place] presents for f/u anxiety.  She is currently being treated with Zoloft 50 mg/d.  Doing well on this medication.  No thoughts of harming self or others. No self-medication with alcohol, prescription drugs or illicit drugs. Pt is not following with a counselor/psychologist.  The patient is currently training for marathon.  Over the past several weeks, when she is at rest her heart rate will drop into the 40s.  She will feel her heartbeat is very pronounced.  She does not feel like she is having any skipped beats, chest pain, or shortness of breath.  She is usually resting when this happens.  She will sometimes feel lightheaded or like she is going to pass out, but never has.  She does admit her intake of water could be better.  This has never happened to her before.  This does not happen when she runs.  She does have a daughter who has heart issues.  They typically last for 5 minutes and takes place 2-3 times per day over the past 3 weeks.  No other identified triggers such as illicit drug use, caffeine, or alcohol.  ROS Psych: No homicidal or suicidal thoughts Cardiac: No CP  Past Medical History:  Diagnosis Date  . GAD (generalized anxiety disorder)    Allergies as of 04/14/2019   No Known Allergies     Medication List       Accurate as of April 14, 2019 12:14 PM. If you have any questions, ask your nurse or doctor.        sertraline 50 MG tablet Commonly known as: ZOLOFT Take 1 tablet (50 mg total) by mouth daily.       Exam BP 102/68 (BP Location: Left Arm, Patient Position: Sitting, Cuff Size: Normal)   Pulse (!) 56   Temp 98 F (36.7 C) (Temporal)   Ht 5\' 1"  (1.549 m)   Wt 102 lb 2 oz (46.3 kg)   SpO2 98%   BMI 19.30 kg/m  General:  well developed, well nourished, in no apparent distress Neck: neck supple without adenopathy, thyromegaly, or masses Lungs:   clear to auscultation, breath sounds equal bilaterally, no respiratory distress Cardio:  regular rate and rhythm without murmurs, heart sounds without clicks or rubs Psych: well oriented with normal range of affect and age-appropriate judgement/insight, alert and oriented x4.  Assessment and Plan  GAD (generalized anxiety disorder) - Plan: sertraline (ZOLOFT) 50 MG tablet  Bradycardia  Lightheadedness  Refill Zoloft.  I would like her to contact her insurance company to make sure they would cover the Holter monitor.  Would hopefully get a 24-hour test.  Would consider referring to cardiology versus ordering an echo pending those results. F/u in 6 months for a physical otherwise. The patient voiced understanding and agreement to the plan.  [place], DO 04/14/19 12:14 PM

## 2019-05-08 ENCOUNTER — Encounter (HOSPITAL_BASED_OUTPATIENT_CLINIC_OR_DEPARTMENT_OTHER): Payer: Self-pay | Admitting: *Deleted

## 2019-05-08 ENCOUNTER — Emergency Department (HOSPITAL_BASED_OUTPATIENT_CLINIC_OR_DEPARTMENT_OTHER)
Admission: EM | Admit: 2019-05-08 | Discharge: 2019-05-08 | Disposition: A | Discharge status: Home / Self Care | Payer: BLUE CROSS/BLUE SHIELD | Attending: Emergency Medicine | Admitting: Emergency Medicine

## 2019-05-08 ENCOUNTER — Encounter: Payer: Self-pay | Admitting: Medical

## 2019-05-08 ENCOUNTER — Ambulatory Visit (INDEPENDENT_AMBULATORY_CARE_PROVIDER_SITE_OTHER): Payer: BLUE CROSS/BLUE SHIELD | Admitting: Medical

## 2019-05-08 ENCOUNTER — Ambulatory Visit: Payer: Self-pay | Admitting: *Deleted

## 2019-05-08 ENCOUNTER — Other Ambulatory Visit: Payer: Self-pay

## 2019-05-08 VITALS — BP 109/72 | HR 54 | Temp 98.1°F | Resp 16 | Ht 61.0 in | Wt 101.8 lb

## 2019-05-08 DIAGNOSIS — R001 Bradycardia, unspecified: Secondary | ICD-10-CM

## 2019-05-08 DIAGNOSIS — L089 Local infection of the skin and subcutaneous tissue, unspecified: Secondary | ICD-10-CM | POA: Diagnosis not present

## 2019-05-08 DIAGNOSIS — R002 Palpitations: Secondary | ICD-10-CM | POA: Diagnosis not present

## 2019-05-08 DIAGNOSIS — L0291 Cutaneous abscess, unspecified: Secondary | ICD-10-CM

## 2019-05-08 DIAGNOSIS — Z23 Encounter for immunization: Secondary | ICD-10-CM | POA: Diagnosis not present

## 2019-05-08 DIAGNOSIS — L03211 Cellulitis of face: Secondary | ICD-10-CM

## 2019-05-08 DIAGNOSIS — L0201 Cutaneous abscess of face: Secondary | ICD-10-CM | POA: Diagnosis not present

## 2019-05-08 MED ORDER — OXYCODONE HCL 5 MG PO TABS: 5.0000 mg | ORAL_TABLET | ORAL | 0 refills | Status: DC | PRN

## 2019-05-08 MED ORDER — AMOXICILLIN-POT CLAVULANATE 875-125 MG PO TABS: 1.0000 | ORAL_TABLET | Freq: Two times a day (BID) | ORAL | 0 refills | Status: AC

## 2019-05-08 MED ORDER — SULFAMETHOXAZOLE-TRIMETHOPRIM 800-160 MG PO TABS: 1.0000 | ORAL_TABLET | Freq: Two times a day (BID) | ORAL | 0 refills | Status: DC

## 2019-05-08 MED ORDER — DOXYCYCLINE HYCLATE 100 MG PO TABS: 100.0000 mg | ORAL_TABLET | Freq: Two times a day (BID) | ORAL | 0 refills | Status: DC

## 2019-05-08 MED ORDER — CEFTRIAXONE SODIUM 1 G IJ SOLR
1.0000 g | Freq: Once | INTRAMUSCULAR | Status: AC
  Administered 2019-05-08: 1 g via INTRAMUSCULAR

## 2019-05-08 MED ORDER — TETANUS-DIPHTH-ACELL PERTUSSIS 5-2.5-18.5 LF-MCG/0.5 IM SUSP
0.5000 mL | Freq: Once | INTRAMUSCULAR | Status: AC
  Administered 2019-05-08: 0.5 mL via INTRAMUSCULAR
  Filled 2019-05-08: qty 0.5

## 2019-05-08 MED ORDER — DOXYCYCLINE HYCLATE 50 MG PO CAPS: 100.0000 mg | ORAL_CAPSULE | Freq: Two times a day (BID) | ORAL | 0 refills | Status: DC

## 2019-05-08 MED ORDER — LIDOCAINE HCL (PF) 1 % IJ SOLN
5.0000 mL | Freq: Once | INTRAMUSCULAR | Status: AC
  Administered 2019-05-08: 5 mL via INTRADERMAL
  Filled 2019-05-08: qty 5

## 2019-05-08 MED ORDER — AMOXICILLIN-POT CLAVULANATE 875-125 MG PO TABS: 1.0000 | ORAL_TABLET | Freq: Two times a day (BID) | ORAL | 0 refills | Status: DC

## 2019-05-08 MED ORDER — DOXYCYCLINE HYCLATE 100 MG PO TABS: 100.0000 mg | ORAL_TABLET | Freq: Two times a day (BID) | ORAL | 0 refills | Status: AC

## 2019-05-08 NOTE — ED Provider Notes (Signed)
I saw and evaluated the patient, reviewed the resident's note and I agree with the findings and plan.  EKG:    Patient with abscess to the right side of her face.  Present for 2 days.  Enlarged significantly today.  No deep area of fluctuance.  Will be needle to get culture done.  Patient received Rocephin at primary care office earlier today and was started on Septra but has not taken it yet.  We will switch her over to doxycycline.  Patient well and concerns about being pregnant.  Warm compresses and she will return for new or worse symptoms.  Most likely will not require incision and drainage.  And definitely do not recommended at this time.  Patient nontoxic no acute distress.   Fredia Sorrow, MD 05/08/19 [id number]

## 2019-05-08 NOTE — ED Triage Notes (Signed)
She has an abscess on the right side of her face. Swelling noted. She states this started after getting scratched by her great dane puppy. She was seen by her MD today and given an antibiotic IM. She was given a Rx for oral antibiotics. Here tonight due to low grade fever.

## 2019-05-08 NOTE — Telephone Encounter (Signed)
Pt saw her pcp today regarding a scratch on her face from her dog. She was treated at the office, given AN antibiotic, and prescription for one.  She now stated that the area on her face has gotten larger and is redder than when she was at the office.  Her face is throbbing.  And has a fever of 100.2 and chills. Per office visit, she should be seen in the ED for treatment. She voiced understanding and will be going to the ED at [facility].  Reason for Disposition . Nursing judgment  Answer Assessment - Initial Assessment Questions 1. REASON FOR CALL or QUESTION: "What is your reason for calling today?" or "How can I best help you?" or "What question do you have that I can help answer?"     Pt calling regarding an dog scratch to her face that has gotten worse since her visit with the provider today.  Answer Assessment - Initial Assessment Questions 1. REASON FOR CALL or QUESTION: "What is your reason for calling today?" or "How can I best help you?" or "What question do you have that I can help answer?"    Advice for treatment of dog scratch after seeing her pcp today.  Protocols used: NO GUIDELINE OR REFERENCE AVAILABLE-A-AH, INFORMATION ONLY CALL-A-AH, INFORMATION ONLY CALL - NO TRIAGE-A-AH

## 2019-05-08 NOTE — Progress Notes (Signed)
Subjective:    Patient ID: Melinda Nichols, female    DOB: 1981/03/29, 38 y.o.   MRN: [id number]  HPI  Pt in with new rt side jaw and neck abrasion. Pt got scratched by [place] dane puppy. Pt was playing with puppy one year old. Dog accidentally pawed her. No bite.  Pt states rt jaw sore and tender. No fever, no chills or sweats.  Pt thinks up to date on tetanus.     Pt has low pulse today. Pt states has palpitation at times. When she checks her pulse on smart watch pulse will get into the 40's. Pt does jog. She runs very long distance. Example 20 miles at times. For years some palpitations but now more frequent. Pt has discussed with Dr. Carmelia Roller. He was considering getting her to wear heart monitor. Pt found out recently that our office not covered under her plan. Last palpitation yesterday. Lasted about 1-2 minutes. On Monday had 6 different palpitation events.   lmp- about one one week ago.   Review of Systems  Constitutional: Negative for chills, fatigue and fever.  Respiratory: Negative for cough, chest tightness, shortness of breath and wheezing.   Cardiovascular: Negative for chest pain and palpitations.       See hpi.  Gastrointestinal: Negative for abdominal pain.  Musculoskeletal: Negative for back pain.  Skin:       See hpi.  Neurological: Negative for dizziness, syncope, weakness, light-headedness, numbness and headaches.  Hematological: Negative for adenopathy. Does not bruise/bleed easily.  Psychiatric/Behavioral: Negative for behavioral problems and decreased concentration.    Past Medical History:  Diagnosis Date  . GAD (generalized anxiety disorder)      Social History   Socioeconomic History  . Marital status: Divorced    Spouse name: Not on file  . Number of children: Not on file  . Years of education: Not on file  . Highest education level: Not on file  Occupational History  . Not on file  Social Needs  . Financial resource strain: Not on file  .  Food insecurity    Worry: Not on file    Inability: Not on file  . Transportation needs    Medical: Not on file    Non-medical: Not on file  Tobacco Use  . Smoking status: Never Smoker  . Smokeless tobacco: Never Used  Substance and Sexual Activity  . Alcohol use: No  . Drug use: No  . Sexual activity: Never  Lifestyle  . Physical activity    Days per week: Not on file    Minutes per session: Not on file  . Stress: Not on file  Relationships  . Social Musician on phone: Not on file    Gets together: Not on file    Attends religious service: Not on file    Active member of club or organization: Not on file    Attends meetings of clubs or organizations: Not on file    Relationship status: Not on file  . Intimate partner violence    Fear of current or ex partner: Not on file    Emotionally abused: Not on file    Physically abused: Not on file    Forced sexual activity: Not on file  Other Topics Concern  . Not on file  Social History Narrative  . Not on file    Past Surgical History:  Procedure Laterality Date  . ABDOMINAL SURGERY    . CESAREAN SECTION  2003  Family History  Problem Relation Age of Onset  . Bipolar disorder Mother   . Bipolar disorder Father   . Cancer Sister        endometrial cancer  . Bipolar disorder Brother   . Heart disease Daughter   . Asthma Daughter     No Known Allergies  Current Outpatient Medications on File Prior to Visit  Medication Sig Dispense Refill  . Choriogonadotropin Alfa (OVIDREL) 250 MCG/0.5ML injection Ovidrel 250 mcg/0.5 mL subcutaneous syringe  Inject 0.5 mL every day by subcutaneous route for 1 day.    . clomiPHENE (CLOMID) 50 MG tablet clomiphene citrate 50 mg tablet    . sertraline (ZOLOFT) 50 MG tablet Take 1 tablet (50 mg total) by mouth daily. 90 tablet 2   No current facility-administered medications on file prior to visit.     BP 109/72   Pulse (!) 46   Temp 98.1 F (36.7 C) (Temporal)    Resp 16   Ht 5\' 1"  (1.549 m)   Wt 101 lb 12.8 oz (46.2 kg)   SpO2 100%   BMI 19.23 kg/m       Objective:   Physical Exam  General Mental Status- Alert. General Appearance- Not in acute distress.   Skin General: Color- Normal Color. Moisture- Normal Moisture.  Neck Carotid Arteries- Normal color. Moisture- Normal Moisture. No carotid bruits. No JVD.  Chest and Lung Exam Auscultation: Breath Sounds:-Normal.  Cardiovascular Auscultation:Rythm- Regular. Murmurs & Other Heart Sounds:Auscultation of the heart reveals- No Murmurs.  Neurologic Cranial Nerve exam:- CN III-XII intact(No nystagmus), symmetric smile. Strength:- 5/5 equal and symmetric strength both upper and lower extremities.  Face- rt side mandible region mid aspect scab. With 2 cm indurated, warm and tender area. No redness. No dc.  Neck- small scab/abraision. But not tender. No infection appearance     Assessment & Plan:  You do appear to have skin infection secondary to dog scratch mark rt side of face. Neck does not look infected. We gave you rocephin 1 gram im today since approaching the weekend and want you to improve faster in light of location of infection. Also rx bactrim DS antibiotic. I think this give coverage of various bacteria that may be present after dog scratch.  If area worsens or changes notify us. But if abscess appearance forms then ED. None formed presently but this is possible.  Update me by my chart on Sunday.  I went ahead and referred you to cardiologist at [facility] per your request. EKG today declined. If delay in appointment and frequency increasing then can be seen here for ekg. If any severe significant symptoms then ED evaluation.  Follow up 4 days or as needed  25 minutes spent with pt. 50% of time spent counseling pt on plan gong forward  [employer], [employer]

## 2019-05-08 NOTE — ED Provider Notes (Signed)
[facility] EMERGENCY DEPARTMENT Provider Note   CSN: [id number] Arrival date & time: 05/08/19  1813  History    Chief Complaint  Patient presents with  . Abscess    HPI Melinda Nichols is a 38 y.o. female with no significant PMHx , who presents to the ED with right mandibular abscess.  Patient reports that she was scratched by her dog, a great Dane, on her face 2 days ago (05/06/19).  Patient reports that it first looked like a pimple and she tried to squeeze it but nothing came out.  She reports that it is just been enlarging for the last 2 days and it is becoming more painful.  She denies any difficulty with eating or pain with occlusion.  She does endorse having a fever of 100.7 at home.  She went to her PCP this morning and was given a shot of antibiotics as well as oral antibiotics to go home with.  She reports that her PCP told her to go to the ED if it worsened.  Allergies: Patient has no known allergies. Medications: No current facility-administered medications for this encounter.   Current Outpatient Medications:  .  Choriogonadotropin Alfa (OVIDREL) 250 MCG/0.5ML injection, Ovidrel 250 mcg/0.5 mL subcutaneous syringe  Inject 0.5 mL every day by subcutaneous route for 1 day., Disp: , Rfl:  .  clomiPHENE (CLOMID) 50 MG tablet, clomiphene citrate 50 mg tablet, Disp: , Rfl:  .  sertraline (ZOLOFT) 50 MG tablet, Take 1 tablet (50 mg total) by mouth daily., Disp: 90 tablet, Rfl: 2 .  sulfamethoxazole-trimethoprim (BACTRIM DS) 800-160 MG tablet, Take 1 tablet by mouth 2 (two) times daily., Disp: 20 tablet, Rfl: 0   Past Medical/Surgical History Past Medical History:  Diagnosis Date  . GAD (generalized anxiety disorder)     Patient Active Problem List   Diagnosis Date Noted  . GAD (generalized anxiety disorder) 01/16/2018    Past Surgical History:  Procedure Laterality Date  . ABDOMINAL SURGERY    . CESAREAN SECTION  2003    OB History  No obstetric history on  file.    Family History  Problem Relation Age of Onset  . Bipolar disorder Mother   . Bipolar disorder Father   . Cancer Sister        endometrial cancer  . Bipolar disorder Brother   . Heart disease Daughter   . Asthma Daughter    Social History   Tobacco Use  . Smoking status: Never Smoker  . Smokeless tobacco: Never Used  Substance Use Topics  . Alcohol use: No  . Drug use: No   Review of Systems Review of Systems  Constitutional: Positive for fever. Negative for appetite change and chills.  HENT: Negative for dental problem, ear pain, mouth sores, sinus pain, sore throat, trouble swallowing and voice change.   Eyes: Negative for pain and visual disturbance.  Respiratory: Negative for cough and shortness of breath.   Cardiovascular: Negative for chest pain and palpitations.  Gastrointestinal: Negative for abdominal pain, nausea and vomiting.  Genitourinary: Negative for dysuria and hematuria.  Musculoskeletal: Negative for arthralgias, back pain, neck pain and neck stiffness.  Skin: Negative for color change and rash.  Neurological: Negative for seizures and syncope.  All other systems reviewed and are negative.   Physical Exam Updated Vital Signs BP 126/73 (BP Location: Left Arm)   Pulse (!) 54   Temp 98.4 F (36.9 C) (Oral)   Resp 14   Ht 5\' 1"  (1.549  m)   Wt 45.8 kg   LMP 05/01/2019   SpO2 100%   BMI 19.08 kg/m   Physical Exam Vitals signs and nursing note reviewed.  Constitutional:      General: She is not in acute distress.    Appearance: She is well-developed.  HENT:     Head: Normocephalic and atraumatic. Mass present. No raccoon eyes, right periorbital erythema, left periorbital erythema or laceration.     Jaw: There is normal jaw occlusion. Pain on movement present.      Right Ear: Hearing, tympanic membrane, ear canal and external ear normal.     Left Ear: Hearing and tympanic membrane normal.     Mouth/Throat:     Lips: Pink.     Mouth:  Mucous membranes are moist.     Pharynx: Oropharynx is clear.     Tonsils: No tonsillar exudate or tonsillar abscesses.  Eyes:     Conjunctiva/sclera: Conjunctivae normal.  Neck:     Musculoskeletal: Neck supple.  Cardiovascular:     Rate and Rhythm: Normal rate and regular rhythm.     Heart sounds: No murmur.  Pulmonary:     Effort: Pulmonary effort is normal. No respiratory distress.     Breath sounds: Normal breath sounds.  Abdominal:     Palpations: Abdomen is soft.     Tenderness: There is no abdominal tenderness.  Skin:    General: Skin is warm and dry.  Neurological:     Mental Status: She is alert.     ED Treatments / Results  Labs (all labs ordered are listed, but only abnormal results are displayed) Labs Reviewed - No data to display  EKG None  Radiology No results found.  Procedures Procedures (including critical care time)  Medications Ordered in ED Medications - No data to display  Initial Impression / Assessment and Plan / ED Course  I have reviewed the triage vital signs and the nursing notes. Pertinent labs & imaging results that were available during my care of the patient were reviewed by me and considered in my medical decision making (see chart for details). Patient presenting with worsening mass on right mandible after dog scratch.  Patient reports that yesterday, it looked like a pimple when she woke up.  Today, on exam the lesion is nodular, circular, erythematous, 5 cm in diameter, with induration but no fluctuance.  There is a central punctum that is scrabbed over.  She does not have any other rashes, lymphadenopathy, but does have surrounding tenderness under her mandible and behind mandibular angle.  On arrival, patient's vitals are within normal limits but she does report having a fever of 100.7 prior to coming in.  Patient was seen at her PCP this morning and given 1 g ceftriaxone IM as well as Bactrim DS twice daily for 10 days.  There does  not seem to be any further extension of the abscess under her mandible.  The lesion is mobile over mandible.  I do not believe that a head CT is necessary in this case.  I will perform bedside ultrasound to see if there is any hypoechoic fluid-filled space or if more consistent with cellulitis.     Final Clinical Impressions(s) / ED Diagnoses   Final diagnoses:  None   ED Discharge Orders    None     Disposition:   Wilber Oliphant, M.D. FM PGY-2      Fredia Sorrow, MD 05/20/19 [phone]

## 2019-05-08 NOTE — Patient Instructions (Signed)
You do appear to have skin infection secondary to dog scratch mark rt side of face. Neck does not look infected. We gave you rocephin 1 gram im today since approaching the weekend and want you to improve faster in light of location of infection. Also rx bactrim DS antibiotic. I think this give coverage of various bacteria that may be present after dog scratch.  If area worsens or changes notify us. But if abscess appearance forms then ED. None formed presently but this is possible.  Update me by my chart on Sunday.  I went ahead and referred you to cardiologist at [facility] per your request. EKG today declined. If delay in appointment and frequency increasing then can be seen here for ekg. If any severe significant symptoms then ED evaluation.  Follow up 4 days or as needed

## 2019-05-08 NOTE — ED Notes (Signed)
Patient verbalizes understanding of discharge instructions. Opportunity for questioning and answers were provided. Armband removed by staff, pt discharged from ED.  

## 2019-05-08 NOTE — Discharge Instructions (Signed)
I hope this feels better soon.  Today we were able to get samples to send to the lab to make sure we are covering with the correct antibiotics.  I think it is important that we continue the strongest therapy, especially since this lesion is on your face and we do not want to spread any further.  Sometimes, skin infections can appear to be worsening in the first 1 to 2 days of treatment.  However, please go straight to the emergency room if you experience any fevers, chills.  You may need IV antibiotics at that time.

## 2019-05-09 NOTE — Telephone Encounter (Signed)
FYI

## 2019-05-14 LAB — AEROBIC/ANAEROBIC CULTURE W GRAM STAIN (SURGICAL/DEEP WOUND): Gram Stain: NONE SEEN

## 2019-05-14 LAB — AEROBIC/ANAEROBIC CULTURE (SURGICAL/DEEP WOUND)

## 2019-05-15 ENCOUNTER — Telehealth (HOSPITAL_BASED_OUTPATIENT_CLINIC_OR_DEPARTMENT_OTHER): Payer: Self-pay

## 2019-05-15 NOTE — Telephone Encounter (Signed)
Post ED Visit - Positive Culture Follow-up  Culture report reviewed by antimicrobial stewardship pharmacist: [place] Team []  Elenor Quinones, Pharm.D. []  Heide Guile, Pharm.D., BCPS AQ-ID []  Parks Neptune, Pharm.D., BCPS []  Alycia Rossetti, Pharm.D., BCPS []  [place], Pharm.D., BCPS, AAHIVP []  Legrand Como, Pharm.D., BCPS, AAHIVP []  Salome Arnt, PharmD, BCPS []  Johnnette Gourd, PharmD, BCPS []  Hughes Better, PharmD, BCPS []  Leeroy Cha, PharmD []  Laqueta Linden, PharmD, BCPS []  Albertina Parr, PharmD X Mimi Pham, [place] Team []  Leodis Sias, PharmD []  Lindell Spar, PharmD []  Royetta Asal, PharmD []  Graylin Shiver, Rph []  Rema Fendt) Glennon Mac, PharmD []  Arlyn Dunning, PharmD []  Netta Cedars, PharmD []  Dia Sitter, PharmD []  Leone Haven, PharmD []  Gretta Arab, PharmD []  Theodis Shove, PharmD []  Peggyann Juba, PharmD []  Reuel Boom, PharmD   Positive aerobic/anaerobic culture -> Few MRSA Treated with Doxycycline, organism sensitive to the same and no further patient follow-up is required at this time.  Dortha Kern 05/15/2019, 9:40 AM

## 2019-07-25 NOTE — L&D Delivery Note (Signed)
Operative Note  PROCEDURE DATE: 06/10/2020  PREOPERATIVE DIAGNOSIS: [redacted]w[redacted]d, placental abruption, IVF pregnancy with donor egg, AMA, history of prior C section  POSTOPERATIVE DIAGNOSIS:Same.  Adhesive disease.  PROCEDURE: Repeat Low TransverseCesarean Section  SURGEON: Dr. Austin Deeann Servidio  INDICATIONS:This is a 39 yo G2P1001 at 34w0drequiring cesarean section secondary to suspected placental abruption.  She was seen in the office for LUQ/epigastric pain and a 7x3 cm retroplacental clot was noted.  She was admitted to antepartum for inpatient monitoring.  Her pain persisted, and then worsened.  HGB and fibrinogen decreased, and prior to delivery, subtle decelerations were noted on continuous monitoring.   Of note, this pregnancy was via IVF and the donor egg is from the patient's twin sister.  Decision made to proceed with LTCS.The risks of cesarean section discussed with the patient included but were not limited to: bleeding which may require transfusion or reoperation; infection which may require antibiotics; injury to bowel, bladder, ureters or other surrounding organs; injury to the fetus; need for additional procedures including hysterectomy in the event of a life-threatening hemorrhage; placental abnormalities wth subsequent pregnancies, incisional problems, thromboembolic phenomenon and other postoperative/anesthesia complications. The patient agreed with the proposed plan, giving informed consent for the procedure.   FINDINGS: Viable femaleinfant in vertex presentation,APGARs 8 and 9, Weight pending, Amniotic fluid clear, placenta slightly adherent with shaggy maternal side.  Clot was present in small amount.  three vessel cord. Grossly normal uterus. .  ANESTHESIA: Epidural ESTIMATED BLOOD LOSS: 1108 cc SPECIMENS: Placenta for pathology COMPLICATIONS: None immediate   PROCEDURE IN DETAIL: The patient received intravenous antibiotics (2g Ancef) and had sequential  compression devices applied to her lower extremities while in the preoperative area. Shewasthen taken to the operating roomwhere epidural anesthesiawas dosed up to surgical level andwas found to be adequate. She was then placed in a dorsal supine position with a leftward tilt,and prepped and draped in a sterile manner.A foley catheter was placed into her bladder and attached to constant gravity. After an adequate timeout was performed, aPfannenstiel skin incision was made with scalpel and carried through to the underlying layer of fascia. Adhesive disease was noted. The fascia was incised in the midline and this incision was extended bilaterally using the Mayo scissors. Kocher clamps were applied to the superior aspect of the fascial incision and the underlying rectus muscles were dissected off bluntly. Dense adhesive disease was present. A similar process was carried out on the inferior aspect of the facial incision. The rectus muscles were separated in the midline bluntly and the peritoneum was entered bluntly. The bladder was noted to be adherent to the lower uterine segment. A bladder flap was created sharply and developed bluntly.Atransverse hysterotomy was made with a scalpel and extended bilaterally bluntly. The bladder blade was then removed. The infant was successfully delivered, and cord was clamped and cut and infant was handed over to awaiting neonatology team. Uterine massage was then administered and the placenta delivered intact with three-vessel cord. Cord gases were taken. The uterus was cleared of clot and debris. The hysterotomy was closed with 0 vicryl.Arista was placed for additional hemostasis. The fascia was closed with 0-Vicryl in a running fashion with good restoration of anatomy. The subcutaneus tissue was irrigated and was reapproximated using three interrupted stitches. The skin was closed with 4-0 Vicryl in a subcuticular fashion.  All surgical site and was  hemostatic at end of procedure without any further bleeding on exam.   Pt tolerated the procedure well. All sponge/lap/needle counts were   correct X 2. Pt taken to recovery room in stable condition.   Austin Jamont Mellin MD 

## 2019-10-22 ENCOUNTER — Ambulatory Visit: Payer: BLUE CROSS/BLUE SHIELD | Admitting: Family Medicine

## 2019-12-15 ENCOUNTER — Ambulatory Visit: Payer: 59 | Attending: Internal Medicine

## 2019-12-15 DIAGNOSIS — Z20822 Contact with and (suspected) exposure to covid-19: Secondary | ICD-10-CM | POA: Insufficient documentation

## 2019-12-16 LAB — SARS-COV-2, NAA 2 DAY TAT

## 2019-12-16 LAB — NOVEL CORONAVIRUS, NAA: SARS-CoV-2, NAA: NOT DETECTED

## 2020-06-07 ENCOUNTER — Inpatient Hospital Stay (HOSPITAL_COMMUNITY)
Admission: AD | Admit: 2020-06-07 | Discharge: 2020-06-12 | DRG: 788 | Disposition: A | Discharge status: Home / Self Care | Payer: 59 | Attending: Obstetrics & Gynecology | Admitting: Obstetrics & Gynecology

## 2020-06-07 ENCOUNTER — Encounter (HOSPITAL_COMMUNITY): Payer: Self-pay

## 2020-06-07 DIAGNOSIS — R1012 Left upper quadrant pain: Secondary | ICD-10-CM | POA: Diagnosis present

## 2020-06-07 DIAGNOSIS — O4593 Premature separation of placenta, unspecified, third trimester: Secondary | ICD-10-CM

## 2020-06-07 DIAGNOSIS — O99892 Other specified diseases and conditions complicating childbirth: Secondary | ICD-10-CM

## 2020-06-07 DIAGNOSIS — Z3A33 33 weeks gestation of pregnancy: Secondary | ICD-10-CM | POA: Diagnosis not present

## 2020-06-07 DIAGNOSIS — Z20822 Contact with and (suspected) exposure to covid-19: Secondary | ICD-10-CM | POA: Diagnosis present

## 2020-06-07 DIAGNOSIS — O34211 Maternal care for low transverse scar from previous cesarean delivery: Secondary | ICD-10-CM | POA: Diagnosis present

## 2020-06-07 DIAGNOSIS — O4590 Premature separation of placenta, unspecified, unspecified trimester: Secondary | ICD-10-CM

## 2020-06-07 DIAGNOSIS — O09813 Supervision of pregnancy resulting from assisted reproductive technology, third trimester: Secondary | ICD-10-CM | POA: Diagnosis not present

## 2020-06-07 DIAGNOSIS — Z333 Pregnant state, gestational carrier: Secondary | ICD-10-CM | POA: Diagnosis not present

## 2020-06-07 DIAGNOSIS — O26893 Other specified pregnancy related conditions, third trimester: Secondary | ICD-10-CM | POA: Diagnosis not present

## 2020-06-07 DIAGNOSIS — O26613 Liver and biliary tract disorders in pregnancy, third trimester: Secondary | ICD-10-CM | POA: Diagnosis not present

## 2020-06-07 DIAGNOSIS — K831 Obstruction of bile duct: Secondary | ICD-10-CM | POA: Diagnosis not present

## 2020-06-07 LAB — CBC
HCT: 28.5 % — ABNORMAL LOW (ref 36.0–46.0)
Hemoglobin: 9.7 g/dL — ABNORMAL LOW (ref 12.0–15.0)
MCH: 31.7 pg (ref 26.0–34.0)
MCHC: 34 g/dL (ref 30.0–36.0)
MCV: 93.1 fL (ref 80.0–100.0)
Platelets: 311 10*3/uL (ref 150–400)
RBC: 3.06 MIL/uL — ABNORMAL LOW (ref 3.87–5.11)
RDW: 12.3 % (ref 11.5–15.5)
WBC: 10 10*3/uL (ref 4.0–10.5)
nRBC: 0 % (ref 0.0–0.2)

## 2020-06-07 LAB — RESPIRATORY PANEL BY RT PCR (FLU A&B, COVID)
Influenza A by PCR: NEGATIVE
Influenza B by PCR: NEGATIVE
SARS Coronavirus 2 by RT PCR: NEGATIVE

## 2020-06-07 MED ORDER — URSODIOL 300 MG PO CAPS
300.0000 mg | ORAL_CAPSULE | Freq: Three times a day (TID) | ORAL | Status: DC
  Administered 2020-06-07 – 2020-06-09 (×6): 300 mg via ORAL
  Filled 2020-06-07 (×8): qty 1

## 2020-06-07 MED ORDER — FAMOTIDINE 20 MG PO TABS
20.0000 mg | ORAL_TABLET | Freq: Every day | ORAL | Status: DC
  Administered 2020-06-07 – 2020-06-09 (×3): 20 mg via ORAL
  Filled 2020-06-07 (×3): qty 1

## 2020-06-07 MED ORDER — BETAMETHASONE SOD PHOS & ACET 6 (3-3) MG/ML IJ SUSP
12.0000 mg | INTRAMUSCULAR | Status: AC
  Administered 2020-06-07 – 2020-06-08 (×2): 12 mg via INTRAMUSCULAR
  Filled 2020-06-07: qty 5

## 2020-06-07 NOTE — H&P (Addendum)
Melinda Nichols is a 39 y.o. female presenting for LUQ pain for 3-4 days which is constant.  No trauma, vaginal bleeding.  GFM.  Also with full body itching and no rash.  Not responsive to topical meds or benadryl No ctxs.  Had normal [place] last week with normal EFW. Seen in office and had reactive NST without ctxs.  US shows new 7.2cm SCH at  Placental edge in LUQ.  BPP 8/8.  Discussed possible partial abruption and recommend [facility] admission for monitoring and BMZ. We checked CMP and bile salts in the office and will tx empically for ICP.Marland Kitchen OB History   No obstetric history on file.    Past Medical History:  Diagnosis Date  . GAD (generalized anxiety disorder)    Past Surgical History:  Procedure Laterality Date  . ABDOMINAL SURGERY    . CESAREAN SECTION  2003   Family History: family history includes Asthma in her daughter; Bipolar disorder in her brother, father, and mother; Cancer in her sister; Heart disease in her daughter. Social History:  reports that she has never smoked. She has never used smokeless tobacco. She reports that she does not drink alcohol and does not use drugs.     Maternal Diabetes: No Genetic Screening: Normal Maternal Ultrasounds/Referrals: Normal  Until today as above Fetal Ultrasounds or other Referrals:  None Maternal Substance Abuse:  No Significant Maternal Medications:  None Significant Maternal Lab Results:  None Other Comments:  None  Review of Systems History   There were no vitals taken for this visit. Exam Physical Exam  Abd Gravid nt but Left upper edge of uterus to rib cage mildly tender to palpation.  No rebound or guarding.  No flank pain No vaginal bleeding.  Cx Cl/th/-3  Vtx Prenatal labs: ABO, Rh:   Antibody:   Rubella:   RPR:    HBsAg:    HIV:    GBS:     Assessment/Plan: IUP at  33  4/7 LUQ pain and US findings c/w marginal/partial abruptio placenta Rec  Hospitalization for fetal monitoring, BMZ series, and fu with [place] to  assess stability tomorrow with MFM (Dr Parke Poisson) to review and consult.  I spoke with him today regarding her care. Delivery by cesarean section (Gestational carrier for her twin sister) Probable cholestasis - bile salts pending.  Dr Parke Poisson recommended starting Ursodiol at 500mg  TID for sxs (hospital only has 300mg  dose) and can adjust accordingly.  06/07/2020, 6:41 PM

## 2020-06-08 ENCOUNTER — Other Ambulatory Visit: Payer: Self-pay

## 2020-06-08 ENCOUNTER — Inpatient Hospital Stay (HOSPITAL_BASED_OUTPATIENT_CLINIC_OR_DEPARTMENT_OTHER): Payer: 59

## 2020-06-08 DIAGNOSIS — O34219 Maternal care for unspecified type scar from previous cesarean delivery: Secondary | ICD-10-CM

## 2020-06-08 DIAGNOSIS — K831 Obstruction of bile duct: Secondary | ICD-10-CM

## 2020-06-08 DIAGNOSIS — O09523 Supervision of elderly multigravida, third trimester: Secondary | ICD-10-CM

## 2020-06-08 DIAGNOSIS — O4593 Premature separation of placenta, unspecified, third trimester: Secondary | ICD-10-CM | POA: Diagnosis not present

## 2020-06-08 DIAGNOSIS — O09813 Supervision of pregnancy resulting from assisted reproductive technology, third trimester: Secondary | ICD-10-CM

## 2020-06-08 DIAGNOSIS — O4590 Premature separation of placenta, unspecified, unspecified trimester: Secondary | ICD-10-CM | POA: Diagnosis present

## 2020-06-08 DIAGNOSIS — O26613 Liver and biliary tract disorders in pregnancy, third trimester: Secondary | ICD-10-CM

## 2020-06-08 DIAGNOSIS — Z333 Pregnant state, gestational carrier: Secondary | ICD-10-CM

## 2020-06-08 DIAGNOSIS — R1012 Left upper quadrant pain: Secondary | ICD-10-CM

## 2020-06-08 DIAGNOSIS — O09293 Supervision of pregnancy with other poor reproductive or obstetric history, third trimester: Secondary | ICD-10-CM

## 2020-06-08 DIAGNOSIS — O26893 Other specified pregnancy related conditions, third trimester: Secondary | ICD-10-CM

## 2020-06-08 DIAGNOSIS — Z3A33 33 weeks gestation of pregnancy: Secondary | ICD-10-CM

## 2020-06-08 LAB — CBC
HCT: 28.8 % — ABNORMAL LOW (ref 36.0–46.0)
Hemoglobin: 9.6 g/dL — ABNORMAL LOW (ref 12.0–15.0)
MCH: 31 pg (ref 26.0–34.0)
MCHC: 33.3 g/dL (ref 30.0–36.0)
MCV: 92.9 fL (ref 80.0–100.0)
Platelets: 306 10*3/uL (ref 150–400)
RBC: 3.1 MIL/uL — ABNORMAL LOW (ref 3.87–5.11)
RDW: 12.4 % (ref 11.5–15.5)
WBC: 12.6 10*3/uL — ABNORMAL HIGH (ref 4.0–10.5)
nRBC: 0 % (ref 0.0–0.2)

## 2020-06-08 LAB — COMPREHENSIVE METABOLIC PANEL
ALT: 16 U/L (ref 0–44)
AST: 23 U/L (ref 15–41)
Albumin: 2.4 g/dL — ABNORMAL LOW (ref 3.5–5.0)
Alkaline Phosphatase: 107 U/L (ref 38–126)
Anion gap: 9 (ref 5–15)
BUN: 6 mg/dL (ref 6–20)
CO2: 20 mmol/L — ABNORMAL LOW (ref 22–32)
Calcium: 8.5 mg/dL — ABNORMAL LOW (ref 8.9–10.3)
Chloride: 107 mmol/L (ref 98–111)
Creatinine, Ser: 0.58 mg/dL (ref 0.44–1.00)
GFR, Estimated: >60 mL/min (ref >60)
Glucose, Bld: 125 mg/dL — ABNORMAL HIGH (ref 70–99)
Potassium: 3.6 mmol/L (ref 3.5–5.1)
Sodium: 136 mmol/L (ref 135–145)
Total Bilirubin: 0.2 mg/dL — ABNORMAL LOW (ref 0.3–1.2)
Total Protein: 5.7 g/dL — ABNORMAL LOW (ref 6.5–8.1)

## 2020-06-08 LAB — FIBRINOGEN: Fibrinogen: 374 mg/dL (ref 210–475)

## 2020-06-08 MED ORDER — CALCIUM CARBONATE ANTACID 500 MG PO CHEW: 2.0000 | CHEWABLE_TABLET | ORAL | Status: DC | PRN

## 2020-06-08 MED ORDER — OXYCODONE-ACETAMINOPHEN 5-325 MG PO TABS
1.0000 | ORAL_TABLET | ORAL | Status: DC | PRN
  Administered 2020-06-08: 1 via ORAL
  Administered 2020-06-09 (×2): 2 via ORAL
  Administered 2020-06-09: 1 via ORAL
  Filled 2020-06-08: qty 2
  Filled 2020-06-08: qty 1
  Filled 2020-06-08 (×2): qty 2

## 2020-06-08 MED ORDER — DOCUSATE SODIUM 100 MG PO CAPS
100.0000 mg | ORAL_CAPSULE | Freq: Every day | ORAL | Status: DC
  Administered 2020-06-08 – 2020-06-09 (×2): 100 mg via ORAL
  Filled 2020-06-08 (×2): qty 1

## 2020-06-08 MED ORDER — PRENATAL MULTIVITAMIN CH
1.0000 | ORAL_TABLET | Freq: Every day | ORAL | Status: DC
  Administered 2020-06-08 – 2020-06-09 (×2): 1 via ORAL
  Filled 2020-06-08 (×2): qty 1

## 2020-06-08 MED ORDER — ZOLPIDEM TARTRATE 5 MG PO TABS: 5.0000 mg | ORAL_TABLET | Freq: Every evening | ORAL | Status: DC | PRN

## 2020-06-08 MED ORDER — SODIUM CHLORIDE 0.9 % IV SOLN
8.0000 mg | Freq: Three times a day (TID) | INTRAVENOUS | Status: DC
  Administered 2020-06-08: 8 mg via INTRAVENOUS
  Filled 2020-06-08 (×4): qty 4

## 2020-06-08 MED ORDER — ACETAMINOPHEN 500 MG PO TABS
1000.0000 mg | ORAL_TABLET | Freq: Four times a day (QID) | ORAL | Status: DC | PRN
  Administered 2020-06-08 – 2020-06-09 (×3): 1000 mg via ORAL
  Filled 2020-06-08 (×3): qty 2

## 2020-06-08 NOTE — Consult Note (Signed)
MFM Consult Note  Melinda Nichols is a 39 year old gravida 2 para 1 currently at 33 weeks and 5 days.  She was seen in consultation due to probable placental abruption. The patient reports that she has been experiencing significant left upper quadrant pain that started about 2 weeks ago.  During an ultrasound performed in the office yesterday, a probable blood clot was noted on the lateral edge of the anterior placenta, indicating that placental abruption may be the cause of the significant left upper quadrant pain. This blood clot was not noted during her prior ultrasound performed one week ago.    Due to this finding the patient was admitted to the hospital and placed on continuous monitoring.  The fetal heart rate tracing has been reactive and no contractions have been noted.  The patient reports that her left upper quadrant pain became worse last night.  However, the pain became tolerable after she took some Tylenol.  Her blood pressures have been within normal limits.  She denies any recent falls or trauma to the abdomen.  She reports feeling fetal movement and denies any vaginal bleeding.  The patient's current pregnancy has also been complicated by severe whole body itching that started about a week ago.  Due to presumed cholestasis of pregnancy, she is currently being treated with Actigall.  Her bile acid levels are currently pending.  The patient is carrying the pregnancy as a surrogate for her identical twin sister, as her sister had to undergo a hysterectomy a few years ago due to endometrial cancer.  This is an IVF pregnancy that was conceived using a donor egg.  The patient's past obstetrical history includes a 37-week cesarean delivery in 2003.  She reports that her daughter was born with the hypoplastic left heart syndrome.  Her daughter is doing well today following surgery for treatment of the hypoplastic left heart syndrome.  The patient denies any other significant past medical or  surgical history.  Her current medications include a prenatal vitamin, a daily baby aspirin, and Actigall for treatment of cholestasis of pregnancy.  The patient reports that she had a growth scan performed in your office last week that showed appropriate fetal growth.  I performed an ultrasound this morning to examine the anterior placenta.  During over 20 minutes of scanning to examine the entire placenta, I could not identify a retroplacental clot.  Low normal amniotic fluid levels with a total AFI of 8.9 cm was noted.  The fetus was in the vertex presentation.  A biophysical profile performed today was 8 out of 8.  Doppler studies of the umbilical arteries performed today shows normal forward flow.    The patient was advised that the retroplacental clot that was noted on the exam yesterday could have resolved as it has been greater than 12 hours since that ultrasound exam.  She was advised that placental abruption is a clinical diagnosis where significant maternal abdominal pain, frequent contractions, and a nonreassuring fetal heart rate tracing may be noted. Not all cases of placental abruption may be visualized on a prenatal ultrasound.  Due to suspected placental abruption with continued left upper quadrant pain, I would recommend that the patient remain in the hospital for close observation and fetal testing until delivery.  She understands that the goal for delivery would be at around 37 weeks.    Delivery prior to 37 weeks would be indicated should her left upper quadrant pain worsen (possibly indicating worsening placental abruption) or should there be nonreassuring  fetal status noted.    She should receive a complete course of antenatal corticosteroids.  As she will be close to 34 weeks once she completes her steroid course, a repeat course of steroids will not be indicated prior to delivery.  She should have fibrinogen levels and liver function tests drawn to help evaluate the placental  abruption, as low fibrinogen levels may indicate that a significant hemorrhage is occurring behind the placenta.  The patient was reassured that the neonatal outcome for delivery at her current gestational age or later is usually good.    In regards to the management of her severe whole body itching, she should continue Actigall for treatment.  The patient understands that it may take a few weeks of treatment with Actigall for her symptoms to resolve, should her whole body itching be due to cholestasis of pregnancy.  At the end of the consultation, the patient and her family stated that all of their questions had been answered to their complete satisfaction.  Thank you for referring this patient for a Maternal-Fetal Medicine consultation.  Recommendations: -Administer a complete course of antenatal corticosteroids -Continue inpatient management until delivery with daily fetal testing -Obtain liver function tests and fibrinogen levels -Weekly ultrasounds to assess amniotic fluid levels and to reassess the placenta -Continue Actigall (up to 500 mg 3 times daily ) for treatment of possible cholestasis of pregnancy  -Goal for delivery would be at around 37 weeks -Delivery prior to 37 weeks would be indicated should her left upper quadrant pain worsen or for nonreassuring fetal status

## 2020-06-08 NOTE — Progress Notes (Signed)
Patient continues to have LUQ pain which comes and goes in severity; currently not as severe as when awakened by pain 2 nights ago.  She denies VB.  No CTX.  Active FM.  Generalized itching has decreased in severity; mostly notices at night.    Vitals:   06/08/20 1141 06/08/20 1452  BP: 119/73 108/62  Pulse: 92 78  Resp: 19 19  Temp: 98.1 F (36.7 C) 98.2 F (36.8 C)  SpO2: 100% 100%   CBC Latest Ref Rng & Units 06/07/2020 03/01/2015 06/26/2011  WBC 4.0 - 10.5 K/uL 10.0 10.7(H) 7.3  Hemoglobin 12.0 - 15.0 g/dL 4.9(S) 49.6 11.5(L)  Hematocrit 36 - 46 % 28.5(L) 38.9 33.9(L)  Platelets 150 - 400 K/uL 311 274 207   Fibrinogen 374 AST 23 ALT 16 Bile acids pending K-B pending  U/S today shows no abruption (U/S in office yesterday showed abruption in LUQ measuring 7.1 x 3.3 cm).  AFI 8.9 cm.  BPP 8/8.  Normal Dopplers.  Gen: A&O x 3 Abd: soft, tender at localized region in left upper quadrant; no palpable abnormalitiy Ext: no c/c/e  39 yo G2P1001 at [redacted]w[redacted]d with placental abruption, ICP -BMZ -Continuous monitoring until BMZ maturity -In patient management until delivery by repeat C/S at 37 weeks at the latest and earlier if worsens clinicallly -Presume ICP-continue Actigall -Fibrinogen and CBC pending tomorrow AM -NICU consult when delivery considered imminent -Weekly u/s for AFI and placental evaluation  Mitchel Honour, DO

## 2020-06-09 ENCOUNTER — Encounter (HOSPITAL_COMMUNITY): Payer: Self-pay | Admitting: Obstetrics & Gynecology

## 2020-06-09 LAB — URINALYSIS, ROUTINE W REFLEX MICROSCOPIC
Bilirubin Urine: NEGATIVE
Glucose, UA: 150 mg/dL — AB
Hgb urine dipstick: NEGATIVE
Ketones, ur: 5 mg/dL — AB
Nitrite: NEGATIVE
Protein, ur: 30 mg/dL — AB
Specific Gravity, Urine: 1.029 (ref 1.005–1.030)
pH: 5 (ref 5.0–8.0)

## 2020-06-09 LAB — CBC
HCT: 27.8 % — ABNORMAL LOW (ref 36.0–46.0)
HCT: 26.9 % — ABNORMAL LOW (ref 36.0–46.0)
Hemoglobin: 9.2 g/dL — ABNORMAL LOW (ref 12.0–15.0)
Hemoglobin: 9 g/dL — ABNORMAL LOW (ref 12.0–15.0)
MCH: 31.3 pg (ref 26.0–34.0)
MCH: 31 pg (ref 26.0–34.0)
MCHC: 33.1 g/dL (ref 30.0–36.0)
MCHC: 33.5 g/dL (ref 30.0–36.0)
MCV: 94.6 fL (ref 80.0–100.0)
MCV: 92.8 fL (ref 80.0–100.0)
Platelets: 294 10*3/uL (ref 150–400)
Platelets: 283 10*3/uL (ref 150–400)
RBC: 2.94 MIL/uL — ABNORMAL LOW (ref 3.87–5.11)
RBC: 2.9 MIL/uL — ABNORMAL LOW (ref 3.87–5.11)
RDW: 12.5 % (ref 11.5–15.5)
RDW: 12.3 % (ref 11.5–15.5)
WBC: 11.5 10*3/uL — ABNORMAL HIGH (ref 4.0–10.5)
WBC: 12.9 10*3/uL — ABNORMAL HIGH (ref 4.0–10.5)
nRBC: 0 % (ref 0.0–0.2)
nRBC: 0 % (ref 0.0–0.2)

## 2020-06-09 LAB — PREPARE RBC (CROSSMATCH)

## 2020-06-09 LAB — GROUP B STREP BY PCR: Group B strep by PCR: POSITIVE — AB

## 2020-06-09 LAB — COMPREHENSIVE METABOLIC PANEL
ALT: 14 U/L (ref 0–44)
AST: 24 U/L (ref 15–41)
Albumin: 2.4 g/dL — ABNORMAL LOW (ref 3.5–5.0)
Alkaline Phosphatase: 93 U/L (ref 38–126)
Anion gap: 6 (ref 5–15)
BUN: 7 mg/dL (ref 6–20)
CO2: 21 mmol/L — ABNORMAL LOW (ref 22–32)
Calcium: 8.3 mg/dL — ABNORMAL LOW (ref 8.9–10.3)
Chloride: 105 mmol/L (ref 98–111)
Creatinine, Ser: 0.65 mg/dL (ref 0.44–1.00)
GFR, Estimated: >60 mL/min (ref >60)
Glucose, Bld: 138 mg/dL — ABNORMAL HIGH (ref 70–99)
Potassium: 3.7 mmol/L (ref 3.5–5.1)
Sodium: 132 mmol/L — ABNORMAL LOW (ref 135–145)
Total Bilirubin: 0.2 mg/dL — ABNORMAL LOW (ref 0.3–1.2)
Total Protein: 5.7 g/dL — ABNORMAL LOW (ref 6.5–8.1)

## 2020-06-09 LAB — AMYLASE: Amylase: 78 U/L (ref 28–100)

## 2020-06-09 LAB — ABO/RH: ABO/RH(D): O POS

## 2020-06-09 LAB — FIBRINOGEN
Fibrinogen: 395 mg/dL (ref 210–475)
Fibrinogen: 344 mg/dL (ref 210–475)

## 2020-06-09 LAB — TYPE AND SCREEN

## 2020-06-09 LAB — LIPASE, BLOOD: Lipase: 35 U/L (ref 11–51)

## 2020-06-09 MED ORDER — CEFAZOLIN SODIUM-DEXTROSE 2-4 GM/100ML-% IV SOLN
2.0000 g | INTRAVENOUS | Status: AC
  Administered 2020-06-10: 2 g via INTRAVENOUS

## 2020-06-09 MED ORDER — SODIUM CHLORIDE 0.9% IV SOLUTION: Freq: Once | INTRAVENOUS | Status: DC

## 2020-06-09 MED ORDER — SOD CITRATE-CITRIC ACID 500-334 MG/5ML PO SOLN
30.0000 mL | ORAL | Status: AC
  Administered 2020-06-10: 30 mL via ORAL
  Filled 2020-06-09: qty 15

## 2020-06-09 MED ORDER — ONDANSETRON 4 MG PO TBDP
4.0000 mg | ORAL_TABLET | Freq: Four times a day (QID) | ORAL | Status: DC | PRN
  Administered 2020-06-09 (×2): 4 mg via ORAL
  Filled 2020-06-09 (×2): qty 1

## 2020-06-09 MED ORDER — CYCLOBENZAPRINE HCL 10 MG PO TABS
5.0000 mg | ORAL_TABLET | Freq: Once | ORAL | Status: AC
  Administered 2020-06-09: 5 mg via ORAL
  Filled 2020-06-09: qty 1

## 2020-06-09 MED ORDER — LACTATED RINGERS IV SOLN: INTRAVENOUS | Status: DC

## 2020-06-09 NOTE — Progress Notes (Signed)
Antepartum Progress Note  LUQ is slightly improved but still intermittently present.  Pain medications are helping.  She denies vaginal bleeding, contractions, LOF, diffuse abdominal pain.  FM is normal. Itching mild.    Vitals:   06/09/20 0742 06/09/20 1122  BP: (!) 106/56 106/62  Pulse: 67 81  Resp: 17 18  Temp: 98.2 F (36.8 C) 97.6 F (36.4 C)  SpO2: 99% 100%   CBC Latest Ref Rng & Units 06/09/2020 06/08/2020 06/07/2020  WBC 4.0 - 10.5 K/uL 11.5(H) 12.6(H) 10.0  Hemoglobin 12.0 - 15.0 g/dL 1.7(P) 1.0(C) 5.8(N)  Hematocrit 36 - 46 % 27.8(L) 28.8(L) 28.5(L)  Platelets 150 - 400 K/uL 294 306 311   Fibrinogen 374 > 395 AST 23 ALT 16 Bile acids pending K-B pending  U/S yesterday with no apparent abruption (U/S in office yesterday showed abruption in LUQ measuring 7.1 x 3.3 cm).  AFI 8.9 cm.  BPP 8/8.  Normal Dopplers.  Gen: A&O x 3 Abd: soft, tender at localized region in left upper quadrant; no palpable abnormalitiy Ext: no c/c/e  39 yo G2P1001 at [redacted]w[redacted]d with placental abruption, ICP - s/p BMZ - s/p cEFM for over 24 hrs.  Will transition to TID.  Reviewed importance of monitoring and toco with patient. Plan to have low threshold to place on monitor. -In patient management until delivery by repeat C/S at 37 weeks at the latest and earlier if worsens clinicallly -Presume ICP-continue Actigall - Bile acids still pending -NICU consult when delivery considered imminent -Weekly u/s for AFI and placental evaluation  Nilda Simmer MD

## 2020-06-09 NOTE — Anesthesia Preprocedure Evaluation (Addendum)
Anesthesia Evaluation  Patient identified by MRN, date of birth, ID band Patient awake    Reviewed: Allergy & Precautions, NPO status , Patient's Chart, lab work & pertinent test results  Airway Mallampati: II  TM Distance: >3 FB Neck ROM: Full    Dental  (+) Dental Advisory Given   Pulmonary neg pulmonary ROS,    breath sounds clear to auscultation       Cardiovascular negative cardio ROS   Rhythm:Regular Rate:Normal     Neuro/Psych negative neurological ROS     GI/Hepatic negative GI ROS, Neg liver ROS,   Endo/Other  negative endocrine ROS  Renal/GU negative Renal ROS     Musculoskeletal   Abdominal   Peds  Hematology  (+) anemia ,   Anesthesia Other Findings   Reproductive/Obstetrics (+) Pregnancy                             Lab Results  Component Value Date   WBC 12.9 (H) 06/09/2020   HGB 9.0 (L) 06/09/2020   HCT 26.9 (L) 06/09/2020   MCV 92.8 06/09/2020   PLT 283 06/09/2020   Lab Results  Component Value Date   CREATININE 0.65 06/09/2020   BUN 7 06/09/2020   NA 132 (L) 06/09/2020   K 3.7 06/09/2020   CL 105 06/09/2020   CO2 21 (L) 06/09/2020    Anesthesia Physical Anesthesia Plan  ASA: III  Anesthesia Plan: Spinal   Post-op Pain Management:    Induction:   PONV Risk Score and Plan: 2 and Dexamethasone, Ondansetron and Treatment may vary due to age or medical condition  Airway Management Planned: Natural Airway  Additional Equipment:   Intra-op Plan:   Post-operative Plan:   Informed Consent: I have reviewed the patients History and Physical, chart, labs and discussed the procedure including the risks, benefits and alternatives for the proposed anesthesia with the patient or authorized representative who has indicated his/her understanding and acceptance.       Plan Discussed with: CRNA  Anesthesia Plan Comments:         Anesthesia Quick  Evaluation

## 2020-06-09 NOTE — Progress Notes (Signed)
At bedside to discuss ongoing pain.  Patient reports persistent LUQ and epigastric pain.  She reports some radiation to her back as well, but is otherwise quite localized. She reports some face flushing with the pain exacerbation, and some tingling in fingers.  She denies shortness of breath.  Pain not not worsen with inspiration.  No cough.  Denies nausea, vomiting, constipation, diarrhea. She denies urinary frequency, dysuria, hematuria.  She denies abdominal pain or tightening elsewhere. She denies palpitations. She cannot associate pain with position or activity. Nothing relieves the pain.  She has been taking pepcid at home and has been continued here. She has taken percocet twice and flexeril once.   Vitals with BMI 06/09/2020 06/09/2020 06/09/2020  Height - - -  Weight - - -  BMI - - -  Systolic 116 99 106  Diastolic 66 61 62  Pulse 78 73 81   On exam, patient in mild distress due to pain.  Pain elicited in epigastric region with light palpation.  No ctx palpable.  We discussed management of suspected partial abruption, including fetal and maternal indications for delivery.  We also discussed the consideration of prematurity and goal to reach [redacted] weeks gestation, or as near to as possible.  - FHT has been reactive and reassuring since admission.  Toco is quiet.  - Abruption labs stable this AM, but will repeat STAT in light of ongoing and worsened pain  - Add CMP, amylase/lipase as well.  - UA  - Low concern for cardiopulmonary process given normal range vitals. Will order EKG for additional reassurance.  Continue inpatient management for suspected partial abruption.  Will complete above testing for broader differential.  All questions answered.

## 2020-06-09 NOTE — Progress Notes (Signed)
At bedside for extensive discussion regarding plan of care.  Lab workup as previously described notable for decreasing fibrinogen as well has HGB.  This, in the setting of worsening pain, is consistent with likely progression of suspected abruption.  Pt is now steroid complete (2nd dose was yesterday evening).  We discussed the risk and benefit of continuing expectant management vs. Delivery.  I believe risks of prematurity do not outweigh risks of continued worsening abruption and recommend delivery.  I spoke with MFM, Dr. Parke Poisson over phone as well and is in agreement.  The patient and her twin sister were involved in our discussion and accept plan for delivery tonight. Risks and benefits of Cesarean delivery were reviewed in detail, including but not limited to bleeding, infection, damage to nearby organs.  NPO, start LR at 125 cc/hr. Will coordinate with anesthesia, L&D, NICU. Will proceed with RLTCS when able.  Nilda Simmer MD

## 2020-06-10 ENCOUNTER — Encounter (HOSPITAL_COMMUNITY): Payer: Self-pay | Admitting: Obstetrics & Gynecology

## 2020-06-10 ENCOUNTER — Inpatient Hospital Stay (HOSPITAL_COMMUNITY): Payer: 59 | Admitting: Anesthesiology

## 2020-06-10 ENCOUNTER — Encounter (HOSPITAL_COMMUNITY)
Admission: AD | Disposition: A | Discharge status: Home / Self Care | Payer: Self-pay | Source: Home / Self Care | Attending: Obstetrics & Gynecology

## 2020-06-10 LAB — CBC
HCT: 17.9 % — ABNORMAL LOW (ref 36.0–46.0)
HCT: 22.4 % — ABNORMAL LOW (ref 36.0–46.0)
HCT: 26.4 % — ABNORMAL LOW (ref 36.0–46.0)
HCT: 25.7 % — ABNORMAL LOW (ref 36.0–46.0)
Hemoglobin: 5.9 g/dL — CL (ref 12.0–15.0)
Hemoglobin: 7.4 g/dL — ABNORMAL LOW (ref 12.0–15.0)
Hemoglobin: 8.9 g/dL — ABNORMAL LOW (ref 12.0–15.0)
Hemoglobin: 8.8 g/dL — ABNORMAL LOW (ref 12.0–15.0)
MCH: 30.7 pg (ref 26.0–34.0)
MCH: 30.5 pg (ref 26.0–34.0)
MCH: 30.3 pg (ref 26.0–34.0)
MCH: 31 pg (ref 26.0–34.0)
MCHC: 33 g/dL (ref 30.0–36.0)
MCHC: 33 g/dL (ref 30.0–36.0)
MCHC: 33.7 g/dL (ref 30.0–36.0)
MCHC: 34.2 g/dL (ref 30.0–36.0)
MCV: 93.2 fL (ref 80.0–100.0)
MCV: 92.2 fL (ref 80.0–100.0)
MCV: 89.8 fL (ref 80.0–100.0)
MCV: 90.5 fL (ref 80.0–100.0)
Platelets: 192 10*3/uL (ref 150–400)
Platelets: 200 10*3/uL (ref 150–400)
Platelets: 190 10*3/uL (ref 150–400)
Platelets: 185 10*3/uL (ref 150–400)
RBC: 1.92 MIL/uL — ABNORMAL LOW (ref 3.87–5.11)
RBC: 2.43 MIL/uL — ABNORMAL LOW (ref 3.87–5.11)
RBC: 2.94 MIL/uL — ABNORMAL LOW (ref 3.87–5.11)
RBC: 2.84 MIL/uL — ABNORMAL LOW (ref 3.87–5.11)
RDW: 12.4 % (ref 11.5–15.5)
RDW: 13.3 % (ref 11.5–15.5)
RDW: 13.8 % (ref 11.5–15.5)
RDW: 14.1 % (ref 11.5–15.5)
WBC: 14.2 10*3/uL — ABNORMAL HIGH (ref 4.0–10.5)
WBC: 18.2 10*3/uL — ABNORMAL HIGH (ref 4.0–10.5)
WBC: 15.3 10*3/uL — ABNORMAL HIGH (ref 4.0–10.5)
WBC: 13.6 10*3/uL — ABNORMAL HIGH (ref 4.0–10.5)
nRBC: 0 % (ref 0.0–0.2)
nRBC: 0 % (ref 0.0–0.2)
nRBC: 0 % (ref 0.0–0.2)
nRBC: 0 % (ref 0.0–0.2)

## 2020-06-10 LAB — URINE CULTURE: Culture: 70000 — AB

## 2020-06-10 LAB — PREPARE RBC (CROSSMATCH)

## 2020-06-10 LAB — TYPE AND SCREEN

## 2020-06-10 SURGERY — Surgical Case
Anesthesia: Spinal

## 2020-06-10 MED ORDER — ONDANSETRON HCL 4 MG/2ML IJ SOLN: 4.0000 mg | Freq: Three times a day (TID) | INTRAMUSCULAR | Status: DC | PRN

## 2020-06-10 MED ORDER — DIPHENHYDRAMINE HCL 25 MG PO CAPS
25.0000 mg | ORAL_CAPSULE | ORAL | Status: DC | PRN
  Administered 2020-06-10: 25 mg via ORAL

## 2020-06-10 MED ORDER — SODIUM CHLORIDE 0.9% FLUSH: 3.0000 mL | INTRAVENOUS | Status: DC | PRN

## 2020-06-10 MED ORDER — DEXAMETHASONE SODIUM PHOSPHATE 4 MG/ML IJ SOLN
INTRAMUSCULAR | Status: AC
  Filled 2020-06-10: qty 1

## 2020-06-10 MED ORDER — NALBUPHINE HCL 10 MG/ML IJ SOLN: 5.0000 mg | Freq: Once | INTRAMUSCULAR | Status: DC | PRN

## 2020-06-10 MED ORDER — OXYTOCIN-SODIUM CHLORIDE 30-0.9 UT/500ML-% IV SOLN
INTRAVENOUS | Status: AC
  Filled 2020-06-10: qty 500

## 2020-06-10 MED ORDER — FAMOTIDINE 20 MG PO TABS: 10.0000 mg | ORAL_TABLET | Freq: Every day | ORAL | Status: DC | PRN

## 2020-06-10 MED ORDER — PHENYLEPHRINE HCL-NACL 20-0.9 MG/250ML-% IV SOLN
INTRAVENOUS | Status: AC
  Filled 2020-06-10: qty 250

## 2020-06-10 MED ORDER — OXYCODONE HCL 5 MG PO TABS: 5.0000 mg | ORAL_TABLET | Freq: Once | ORAL | Status: DC

## 2020-06-10 MED ORDER — POVIDONE-IODINE 5 % EX SOLN: Freq: Once | CUTANEOUS | Status: AC

## 2020-06-10 MED ORDER — TRANEXAMIC ACID-NACL 1000-0.7 MG/100ML-% IV SOLN
INTRAVENOUS | Status: AC
  Filled 2020-06-10: qty 100

## 2020-06-10 MED ORDER — MENTHOL 3 MG MT LOZG: 1.0000 | LOZENGE | OROMUCOSAL | Status: DC | PRN

## 2020-06-10 MED ORDER — IBUPROFEN 800 MG PO TABS: 400.0000 mg | ORAL_TABLET | ORAL | Status: DC | PRN

## 2020-06-10 MED ORDER — DIPHENHYDRAMINE HCL 25 MG PO CAPS
ORAL_CAPSULE | ORAL | Status: AC
  Filled 2020-06-10: qty 1

## 2020-06-10 MED ORDER — SCOPOLAMINE 1 MG/3DAYS TD PT72
MEDICATED_PATCH | TRANSDERMAL | Status: AC
  Filled 2020-06-10: qty 1

## 2020-06-10 MED ORDER — MORPHINE SULFATE (PF) 0.5 MG/ML IJ SOLN
INTRAMUSCULAR | Status: DC | PRN
Start: 2020-06-10 — End: 2020-06-10
  Administered 2020-06-10: 1 mg via EPIDURAL
  Administered 2020-06-10: .5 mg via EPIDURAL

## 2020-06-10 MED ORDER — ONDANSETRON HCL 4 MG/2ML IJ SOLN
INTRAMUSCULAR | Status: AC
  Filled 2020-06-10: qty 2

## 2020-06-10 MED ORDER — ZOLPIDEM TARTRATE 5 MG PO TABS: 5.0000 mg | ORAL_TABLET | Freq: Every evening | ORAL | Status: DC | PRN

## 2020-06-10 MED ORDER — SERTRALINE HCL 50 MG PO TABS
50.0000 mg | ORAL_TABLET | Freq: Every day | ORAL | Status: DC
  Filled 2020-06-10 (×2): qty 1

## 2020-06-10 MED ORDER — OXYCODONE HCL 5 MG PO TABS
5.0000 mg | ORAL_TABLET | ORAL | Status: DC | PRN
  Administered 2020-06-11: 10 mg via ORAL
  Filled 2020-06-10: qty 2

## 2020-06-10 MED ORDER — MEPERIDINE HCL 25 MG/ML IJ SOLN: 6.2500 mg | INTRAMUSCULAR | Status: DC | PRN

## 2020-06-10 MED ORDER — SODIUM CHLORIDE 0.9 % IV SOLN: INTRAVENOUS | Status: DC | PRN

## 2020-06-10 MED ORDER — NALBUPHINE HCL 10 MG/ML IJ SOLN: 5.0000 mg | INTRAMUSCULAR | Status: DC | PRN

## 2020-06-10 MED ORDER — EPHEDRINE SULFATE 50 MG/ML IJ SOLN
INTRAMUSCULAR | Status: DC | PRN
  Administered 2020-06-10 (×2): 5 mg via INTRAVENOUS

## 2020-06-10 MED ORDER — SENNOSIDES-DOCUSATE SODIUM 8.6-50 MG PO TABS
2.0000 | ORAL_TABLET | ORAL | Status: DC
  Administered 2020-06-11 (×2): 2 via ORAL
  Filled 2020-06-10 (×2): qty 2

## 2020-06-10 MED ORDER — DEXAMETHASONE SODIUM PHOSPHATE 4 MG/ML IJ SOLN
INTRAMUSCULAR | Status: DC | PRN
  Administered 2020-06-10: 4 mg via INTRAVENOUS

## 2020-06-10 MED ORDER — SODIUM CHLORIDE 0.9% IV SOLUTION: Freq: Once | INTRAVENOUS | Status: DC

## 2020-06-10 MED ORDER — DEXMEDETOMIDINE (PRECEDEX) IN NS 20 MCG/5ML (4 MCG/ML) IV SYRINGE
PREFILLED_SYRINGE | INTRAVENOUS | Status: DC | PRN
  Administered 2020-06-10: 4 ug via INTRAVENOUS
  Administered 2020-06-10: 8 ug via INTRAVENOUS

## 2020-06-10 MED ORDER — LACTATED RINGERS IV BOLUS: 1000.0000 mL | Freq: Once | INTRAVENOUS | Status: DC

## 2020-06-10 MED ORDER — PHENYLEPHRINE 40 MCG/ML (10ML) SYRINGE FOR IV PUSH (FOR BLOOD PRESSURE SUPPORT)
PREFILLED_SYRINGE | INTRAVENOUS | Status: AC
  Filled 2020-06-10: qty 10

## 2020-06-10 MED ORDER — ACETAMINOPHEN 500 MG PO TABS
1000.0000 mg | ORAL_TABLET | Freq: Four times a day (QID) | ORAL | Status: AC
  Administered 2020-06-10 – 2020-06-11 (×4): 1000 mg via ORAL
  Filled 2020-06-10 (×3): qty 2

## 2020-06-10 MED ORDER — DIPHENHYDRAMINE HCL 25 MG PO CAPS
25.0000 mg | ORAL_CAPSULE | Freq: Four times a day (QID) | ORAL | Status: DC | PRN
  Administered 2020-06-10: 25 mg via ORAL
  Filled 2020-06-10: qty 1

## 2020-06-10 MED ORDER — POVIDONE-IODINE 5 % EX SOLN: Freq: Once | CUTANEOUS | Status: DC

## 2020-06-10 MED ORDER — SODIUM CHLORIDE 0.9 % IR SOLN
Status: DC | PRN
  Administered 2020-06-10: 1

## 2020-06-10 MED ORDER — PRENATAL MULTIVITAMIN CH
1.0000 | ORAL_TABLET | Freq: Every day | ORAL | Status: DC
  Filled 2020-06-10: qty 1

## 2020-06-10 MED ORDER — CEFAZOLIN SODIUM-DEXTROSE 2-4 GM/100ML-% IV SOLN
INTRAVENOUS | Status: AC
  Filled 2020-06-10: qty 100

## 2020-06-10 MED ORDER — LACTATED RINGERS IV SOLN: INTRAVENOUS | Status: DC

## 2020-06-10 MED ORDER — ALBUMIN HUMAN 5 % IV SOLN
INTRAVENOUS | Status: AC
  Filled 2020-06-10: qty 250

## 2020-06-10 MED ORDER — FENTANYL CITRATE (PF) 100 MCG/2ML IJ SOLN
INTRAMUSCULAR | Status: AC
  Filled 2020-06-10: qty 2

## 2020-06-10 MED ORDER — NALOXONE HCL 0.4 MG/ML IJ SOLN: 0.4000 mg | INTRAMUSCULAR | Status: DC | PRN

## 2020-06-10 MED ORDER — OXYTOCIN-SODIUM CHLORIDE 30-0.9 UT/500ML-% IV SOLN
INTRAVENOUS | Status: DC | PRN
  Administered 2020-06-10: 30 [IU] via INTRAVENOUS

## 2020-06-10 MED ORDER — NALOXONE HCL 4 MG/10ML IJ SOLN
1.0000 ug/kg/h | INTRAVENOUS | Status: DC | PRN
  Filled 2020-06-10: qty 5

## 2020-06-10 MED ORDER — ACETAMINOPHEN 500 MG PO TABS
ORAL_TABLET | ORAL | Status: AC
  Filled 2020-06-10: qty 2

## 2020-06-10 MED ORDER — ONDANSETRON HCL 4 MG/2ML IJ SOLN
INTRAMUSCULAR | Status: DC | PRN
  Administered 2020-06-10: 4 mg via INTRAVENOUS

## 2020-06-10 MED ORDER — GLYCOPYRROLATE 0.2 MG/ML IJ SOLN
INTRAMUSCULAR | Status: DC | PRN
  Administered 2020-06-10: .2 mg via INTRAVENOUS

## 2020-06-10 MED ORDER — IBUPROFEN 600 MG PO TABS
600.0000 mg | ORAL_TABLET | Freq: Four times a day (QID) | ORAL | Status: DC | PRN
  Administered 2020-06-10: 600 mg via ORAL
  Filled 2020-06-10: qty 1

## 2020-06-10 MED ORDER — ACETAMINOPHEN 325 MG PO TABS
650.0000 mg | ORAL_TABLET | ORAL | Status: DC | PRN
  Administered 2020-06-11: 650 mg via ORAL
  Filled 2020-06-10: qty 2

## 2020-06-10 MED ORDER — DIPHENHYDRAMINE HCL 50 MG/ML IJ SOLN: 12.5000 mg | INTRAMUSCULAR | Status: DC | PRN

## 2020-06-10 MED ORDER — MORPHINE SULFATE (PF) 0.5 MG/ML IJ SOLN
INTRAMUSCULAR | Status: DC | PRN
  Administered 2020-06-10: .15 mg via EPIDURAL

## 2020-06-10 MED ORDER — KETOROLAC TROMETHAMINE 30 MG/ML IJ SOLN: 30.0000 mg | Freq: Four times a day (QID) | INTRAMUSCULAR | Status: AC | PRN

## 2020-06-10 MED ORDER — DIBUCAINE (PERIANAL) 1 % EX OINT: 1.0000 "application " | TOPICAL_OINTMENT | CUTANEOUS | Status: DC | PRN

## 2020-06-10 MED ORDER — FENTANYL CITRATE (PF) 100 MCG/2ML IJ SOLN
INTRAMUSCULAR | Status: DC | PRN
  Administered 2020-06-10: 15 ug via INTRAVENOUS

## 2020-06-10 MED ORDER — STERILE WATER FOR IRRIGATION IR SOLN
Status: DC | PRN
  Administered 2020-06-10: 1

## 2020-06-10 MED ORDER — SIMETHICONE 80 MG PO CHEW: 80.0000 mg | CHEWABLE_TABLET | ORAL | Status: DC | PRN

## 2020-06-10 MED ORDER — OXYTOCIN-SODIUM CHLORIDE 30-0.9 UT/500ML-% IV SOLN: 2.5000 [IU]/h | INTRAVENOUS | Status: AC

## 2020-06-10 MED ORDER — SCOPOLAMINE 1 MG/3DAYS TD PT72
1.0000 | MEDICATED_PATCH | Freq: Once | TRANSDERMAL | Status: DC
  Administered 2020-06-10: 1.5 mg via TRANSDERMAL

## 2020-06-10 MED ORDER — FENTANYL CITRATE (PF) 100 MCG/2ML IJ SOLN
INTRAMUSCULAR | Status: DC | PRN
Start: 2020-06-10 — End: 2020-06-10
  Administered 2020-06-10: 50 ug via INTRAVENOUS
  Administered 2020-06-10: 85 ug via INTRAVENOUS

## 2020-06-10 MED ORDER — PHENYLEPHRINE HCL-NACL 20-0.9 MG/250ML-% IV SOLN
INTRAVENOUS | Status: DC | PRN
  Administered 2020-06-10: 60 ug/min via INTRAVENOUS

## 2020-06-10 MED ORDER — MORPHINE SULFATE (PF) 0.5 MG/ML IJ SOLN
INTRAMUSCULAR | Status: AC
  Filled 2020-06-10: qty 10

## 2020-06-10 MED ORDER — GLYCOPYRROLATE PF 0.2 MG/ML IJ SOSY
PREFILLED_SYRINGE | INTRAMUSCULAR | Status: AC
  Filled 2020-06-10: qty 1

## 2020-06-10 MED ORDER — COCONUT OIL OIL: 1.0000 "application " | TOPICAL_OIL | Status: DC | PRN

## 2020-06-10 MED ORDER — WITCH HAZEL-GLYCERIN EX PADS: 1.0000 "application " | MEDICATED_PAD | CUTANEOUS | Status: DC | PRN

## 2020-06-10 MED ORDER — FENTANYL CITRATE (PF) 100 MCG/2ML IJ SOLN: 25.0000 ug | INTRAMUSCULAR | Status: DC | PRN

## 2020-06-10 MED ORDER — EPHEDRINE 5 MG/ML INJ
INTRAVENOUS | Status: AC
  Filled 2020-06-10: qty 10

## 2020-06-10 MED ORDER — BUPIVACAINE IN DEXTROSE 0.75-8.25 % IT SOLN
INTRATHECAL | Status: DC | PRN
  Administered 2020-06-10: 1.6 mL via INTRATHECAL

## 2020-06-10 MED ORDER — SODIUM CHLORIDE 0.9 % IV SOLN: 10.0000 mL/h | Freq: Once | INTRAVENOUS | Status: DC

## 2020-06-10 MED ORDER — TETANUS-DIPHTH-ACELL PERTUSSIS 5-2.5-18.5 LF-MCG/0.5 IM SUSY: 0.5000 mL | PREFILLED_SYRINGE | Freq: Once | INTRAMUSCULAR | Status: DC

## 2020-06-10 MED ORDER — SIMETHICONE 80 MG PO CHEW
80.0000 mg | CHEWABLE_TABLET | ORAL | Status: DC
  Administered 2020-06-11 (×2): 80 mg via ORAL
  Filled 2020-06-10 (×2): qty 1

## 2020-06-10 MED ORDER — SIMETHICONE 80 MG PO CHEW
80.0000 mg | CHEWABLE_TABLET | Freq: Three times a day (TID) | ORAL | Status: DC
  Administered 2020-06-10 – 2020-06-11 (×4): 80 mg via ORAL
  Filled 2020-06-10 (×4): qty 1

## 2020-06-10 SURGICAL SUPPLY — 39 items
ADH SKN CLS APL DERMABOND .7 (GAUZE/BANDAGES/DRESSINGS)
APL SKNCLS STERI-STRIP NONHPOA (GAUZE/BANDAGES/DRESSINGS) ×1
BENZOIN TINCTURE PRP APPL 2/3 (GAUZE/BANDAGES/DRESSINGS) ×2 IMPLANT
CHLORAPREP W/TINT 26ML (MISCELLANEOUS) ×2 IMPLANT
CLAMP CORD UMBIL (MISCELLANEOUS) IMPLANT
CLOSURE STERI STRIP 1/2 X4 (GAUZE/BANDAGES/DRESSINGS) ×1 IMPLANT
CLOTH BEACON ORANGE TIMEOUT ST (SAFETY) ×2 IMPLANT
DERMABOND ADVANCED (GAUZE/BANDAGES/DRESSINGS)
DERMABOND ADVANCED .7 DNX12 (GAUZE/BANDAGES/DRESSINGS) IMPLANT
DRSG OPSITE POSTOP 4X10 (GAUZE/BANDAGES/DRESSINGS) ×2 IMPLANT
ELECT REM PT RETURN 9FT ADLT (ELECTROSURGICAL) ×2
ELECTRODE REM PT RTRN 9FT ADLT (ELECTROSURGICAL) ×1 IMPLANT
EXTRACTOR VACUUM KIWI (MISCELLANEOUS) IMPLANT
GLOVE BIO SURGEON STRL SZ 6 (GLOVE) ×2 IMPLANT
GLOVE BIOGEL PI IND STRL 6 (GLOVE) ×2 IMPLANT
GLOVE BIOGEL PI IND STRL 7.0 (GLOVE) ×1 IMPLANT
GLOVE BIOGEL PI INDICATOR 6 (GLOVE) ×2
GLOVE BIOGEL PI INDICATOR 7.0 (GLOVE) ×1
GOWN STRL REUS W/TWL LRG LVL3 (GOWN DISPOSABLE) ×4 IMPLANT
HEMOSTAT ARISTA ABSORB 3G PWDR (HEMOSTASIS) ×1 IMPLANT
KIT ABG SYR 3ML LUER SLIP (SYRINGE) ×2 IMPLANT
NDL HYPO 25X5/8 SAFETYGLIDE (NEEDLE) ×1 IMPLANT
NEEDLE HYPO 25X5/8 SAFETYGLIDE (NEEDLE) ×2 IMPLANT
NS IRRIG 1000ML POUR BTL (IV SOLUTION) ×2 IMPLANT
PACK C SECTION WH (CUSTOM PROCEDURE TRAY) ×2 IMPLANT
PAD OB MATERNITY 4.3X12.25 (PERSONAL CARE ITEMS) ×2 IMPLANT
PENCIL SMOKE EVAC W/HOLSTER (ELECTROSURGICAL) ×2 IMPLANT
STRIP CLOSURE SKIN 1/2X4 (GAUZE/BANDAGES/DRESSINGS) ×2 IMPLANT
SUT CHROMIC 0 CTX 36 (SUTURE) ×6 IMPLANT
SUT MON AB 2-0 CT1 27 (SUTURE) ×2 IMPLANT
SUT PDS AB 0 CT1 27 (SUTURE) IMPLANT
SUT PLAIN 0 NONE (SUTURE) IMPLANT
SUT VIC AB 0 CT1 27 (SUTURE) ×2
SUT VIC AB 0 CT1 27XBRD ANBCTR (SUTURE) ×1 IMPLANT
SUT VIC AB 0 CT1 36 (SUTURE) IMPLANT
SUT VIC AB 4-0 KS 27 (SUTURE) IMPLANT
TOWEL OR 17X24 6PK STRL BLUE (TOWEL DISPOSABLE) ×2 IMPLANT
TRAY FOLEY W/BAG SLVR 14FR LF (SET/KITS/TRAYS/PACK) IMPLANT
WATER STERILE IRR 1000ML POUR (IV SOLUTION) ×2 IMPLANT

## 2020-06-10 NOTE — Transfer of Care (Signed)
Immediate Anesthesia Transfer of Care Note  Patient: Melinda Nichols  Procedure(s) Performed: REPEAT CESAREAN SECTION EDC: 07-22-20 PREVIOUS X 1  ALLERG: NKDA (N/A )  Patient Location: PACU  Anesthesia Type:Spinal  Level of Consciousness: awake, alert  and oriented  Airway & Oxygen Therapy: Patient Spontanous Breathing  Post-op Assessment: Report given to RN and Post -op Vital signs reviewed and stable  Post vital signs: Reviewed and stable  Last Vitals:  Vitals Value Taken Time  BP 102/61 06/10/20 0400  Temp 36.7 C 06/10/20 0400  Pulse 85 06/10/20 0415  Resp 18 06/10/20 0415  SpO2 95 % 06/10/20 0415  Vitals shown include unvalidated device data.  Last Pain:  Vitals:   06/10/20 0400  TempSrc:   PainSc: 0-No pain      Patients Stated Pain Goal: 6 (06/09/20 1806)  Complications: No complications documented.

## 2020-06-10 NOTE — Progress Notes (Signed)
500cc LR bolus given as indicated by C.Sterling,CRNA for hypotension. Troy Sine

## 2020-06-10 NOTE — Op Note (Signed)
Operative Note  PROCEDURE DATE: 06/10/2020  PREOPERATIVE DIAGNOSIS: [redacted]w[redacted]d, placental abruption, IVF pregnancy with donor egg, AMA, history of prior C section  POSTOPERATIVE DIAGNOSIS:Same.  Adhesive disease.  PROCEDURE: Repeat Low TransverseCesarean Section  SURGEON: Dr. Nilda Simmer  INDICATIONS:This is a 39 yo G2P1001 at 65w0drequiring cesarean section secondary to suspected placental abruption.  She was seen in the office for LUQ/epigastric pain and a 7x3 cm retroplacental clot was noted.  She was admitted to antepartum for inpatient monitoring.  Her pain persisted, and then worsened.  HGB and fibrinogen decreased, and prior to delivery, subtle decelerations were noted on continuous monitoring.   Of note, this pregnancy was via IVF and the donor egg is from the patient's twin sister.  Decision made to proceed with LTCS.The risks of cesarean section discussed with the patient included but were not limited to: bleeding which may require transfusion or reoperation; infection which may require antibiotics; injury to bowel, bladder, ureters or other surrounding organs; injury to the fetus; need for additional procedures including hysterectomy in the event of a life-threatening hemorrhage; placental abnormalities wth subsequent pregnancies, incisional problems, thromboembolic phenomenon and other postoperative/anesthesia complications. The patient agreed with the proposed plan, giving informed consent for the procedure.   FINDINGS: Viable maleinfant in vertex presentation,APGARs 8 and 9, Weight pending, Amniotic fluid clear, placenta slightly adherent with shaggy maternal side.  Clot was present in small amount.  three vessel cord. Grossly normal uterus. .  ANESTHESIA: Epidural ESTIMATED BLOOD LOSS: 1108 cc SPECIMENS: Placenta for pathology COMPLICATIONS: None immediate   PROCEDURE IN DETAIL: The patient received intravenous antibiotics (2g Ancef) and had sequential  compression devices applied to her lower extremities while in the preoperative area. [place] taken to the operating roomwhere epidural anesthesiawas dosed up to surgical level andwas found to be adequate. She was then placed in a dorsal supine position with a leftward tilt,and prepped and draped in a sterile manner.A foley catheter was placed into her bladder and attached to constant gravity. After an adequate timeout was performed, aPfannenstiel skin incision was made with scalpel and carried through to the underlying layer of fascia. Adhesive disease was noted. The fascia was incised in the midline and this incision was extended bilaterally using the Mayo scissors. Kocher clamps were applied to the superior aspect of the fascial incision and the underlying rectus muscles were dissected off bluntly. Dense adhesive disease was present. A similar process was carried out on the inferior aspect of the facial incision. The rectus muscles were separated in the midline bluntly and the peritoneum was entered bluntly. The bladder was noted to be adherent to the lower uterine segment. A bladder flap was created sharply and developed bluntly.Atransverse hysterotomy was made with a scalpel and extended bilaterally bluntly. The bladder blade was then removed. The infant was successfully delivered, and cord was clamped and cut and infant was handed over to awaiting neonatology team. Uterine massage was then administered and the placenta delivered intact with three-vessel cord. Cord gases were taken. The uterus was cleared of clot and debris. The hysterotomy was closed with 0 vicryl.Arista was placed for additional hemostasis. The fascia was closed with 0-Vicryl in a running fashion with good restoration of anatomy. The subcutaneus tissue was irrigated and was reapproximated using three interrupted stitches. The skin was closed with 4-0 Vicryl in a subcuticular fashion.  All surgical site and was  hemostatic at end of procedure without any further bleeding on exam.   Pt tolerated the procedure well. All sponge/lap/needle counts were  correct X 2. Pt taken to recovery room in stable condition.   Nilda Simmer MD

## 2020-06-10 NOTE — Progress Notes (Signed)
CRITICAL VALUE ALERT  Critical Value: hgb 5.9  Date & Time Notied:  0544  Provider Notified: dr fitzgerald  Orders Received/Actions taken: md bedside awaiting new orders

## 2020-06-10 NOTE — Anesthesia Procedure Notes (Signed)
Spinal  Patient location during procedure: OR Start time: 06/10/2020 2:15 AM End time: 06/10/2020 2:22 AM Staffing Performed: anesthesiologist  Anesthesiologist: Marcene Duos, MD Preanesthetic Checklist Completed: patient identified, IV checked, site marked, risks and benefits discussed, surgical consent, monitors and equipment checked, pre-op evaluation and timeout performed Spinal Block Patient position: sitting Prep: DuraPrep Patient monitoring: heart rate, cardiac monitor, continuous pulse ox and blood pressure Approach: midline Location: L3-4 Injection technique: single-shot Needle Needle type: Pencan  Needle gauge: 24 G Needle length: 9 cm Assessment Sensory level: T6

## 2020-06-10 NOTE — Anesthesia Postprocedure Evaluation (Signed)
Anesthesia Post Note  Patient: Melinda Nichols  Procedure(s) Performed: REPEAT CESAREAN SECTION EDC: 07-22-20 PREVIOUS X 1  ALLERG: NKDA (N/A )     Patient location during evaluation: PACU Anesthesia Type: Spinal Level of consciousness: awake and alert Pain management: pain level controlled Vital Signs Assessment: post-procedure vital signs reviewed and stable Respiratory status: spontaneous breathing and respiratory function stable Cardiovascular status: blood pressure returned to baseline and stable Postop Assessment: spinal receding Anesthetic complications: no   No complications documented.  Last Vitals:  Vitals:   06/10/20 0700 06/10/20 0715  BP: (!) 93/51 (!) 91/50  Pulse: 68 67  Resp: (!) 21 10  Temp:    SpO2: 96% 100%    Last Pain:  Vitals:   06/10/20 0700  TempSrc:   PainSc: 4    Pain Goal: Patients Stated Pain Goal: 6 (06/09/20 1806)  LLE Motor Response: Purposeful movement (06/10/20 0700) LLE Sensation: Full sensation (06/10/20 0700) RLE Motor Response: Purposeful movement (06/10/20 0700) RLE Sensation: Full sensation (06/10/20 0700)     Epidural/Spinal Function Cutaneous sensation: Normal sensation (06/10/20 0715), Patient able to flex knees: Yes (06/10/20 0715), Patient able to lift hips off bed: Yes (06/10/20 0715), Back pain beyond tenderness at insertion site: No (06/10/20 0715), Progressively worsening motor and/or sensory loss: No (06/10/20 0715), Bowel and/or bladder incontinence post epidural: No (06/10/20 0715)  Chasya Zenz DANIEL

## 2020-06-10 NOTE — Progress Notes (Signed)
Subjective: Postpartum Day 0: Cesarean Delivery Patient reports some incisional pain.   Had some itching after C/S that responded to Benadryl. Much less itching now that antepartum. Objective: Vital signs in last 24 hours: Temp:  [97.6 F (36.4 C)-98.5 F (36.9 C)] 98.3 F (36.8 C) (11/18 0919) Pulse Rate:  [54-88] 57 (11/18 0919) Resp:  [10-28] 17 (11/18 0919) BP: (82-116)/(44-66) 98/53 (11/18 0919) SpO2:  [93 %-100 %] 98 % (11/18 0919)  Physical Exam:  General: alert, cooperative and no distress Lochia: appropriate Uterine Fundus: firm Incision: healing well DVT Evaluation: No evidence of DVT seen on physical exam.  Recent Labs    06/10/20 0519 06/10/20 0846  HGB 5.9* 7.4*  HCT 17.9* 22.4*    Assessment/Plan: Status post Cesarean section.  Unit #2 of PRBC just started. D/W plan with patient. Will finish this PRBC, sit up at edge of bed this afternoon and ambulate later. Some incisional pain but tolerable at present. CBC in am   Roselle Locus II 06/10/2020, 9:49 AM

## 2020-06-11 ENCOUNTER — Encounter (HOSPITAL_COMMUNITY): Payer: Self-pay | Admitting: Obstetrics and Gynecology

## 2020-06-11 LAB — TYPE AND SCREEN
ABO/RH(D): O POS
Antibody Screen: NEGATIVE
Unit division: 0
Unit division: 0
Unit division: 0

## 2020-06-11 LAB — CBC
HCT: 24.7 % — ABNORMAL LOW (ref 36.0–46.0)
Hemoglobin: 8.3 g/dL — ABNORMAL LOW (ref 12.0–15.0)
MCH: 30.6 pg (ref 26.0–34.0)
MCHC: 33.6 g/dL (ref 30.0–36.0)
MCV: 91.1 fL (ref 80.0–100.0)
Platelets: 178 10*3/uL (ref 150–400)
RBC: 2.71 MIL/uL — ABNORMAL LOW (ref 3.87–5.11)
RDW: 14.6 % (ref 11.5–15.5)
WBC: 11 10*3/uL — ABNORMAL HIGH (ref 4.0–10.5)
nRBC: 0 % (ref 0.0–0.2)

## 2020-06-11 LAB — BPAM RBC
Blood Product Expiration Date: 202112182359
Blood Product Expiration Date: 202112232359
Blood Product Expiration Date: 202112232359
ISSUE DATE / TIME: 202111162052
ISSUE DATE / TIME: 202111180607
ISSUE DATE / TIME: 202111180853
Unit Type and Rh: 5100
Unit Type and Rh: 5100
Unit Type and Rh: 5100

## 2020-06-11 MED ORDER — IBUPROFEN 600 MG PO TABS
600.0000 mg | ORAL_TABLET | Freq: Four times a day (QID) | ORAL | Status: DC
  Administered 2020-06-11 – 2020-06-12 (×5): 600 mg via ORAL
  Filled 2020-06-11 (×4): qty 1

## 2020-06-11 MED ORDER — OXYCODONE HCL 5 MG PO TABS
5.0000 mg | ORAL_TABLET | ORAL | Status: DC | PRN
  Administered 2020-06-11 – 2020-06-12 (×5): 5 mg via ORAL
  Filled 2020-06-11 (×5): qty 1

## 2020-06-11 MED ORDER — IBUPROFEN 600 MG PO TABS: 600.0000 mg | ORAL_TABLET | Freq: Four times a day (QID) | ORAL | Status: DC | PRN

## 2020-06-11 NOTE — Plan of Care (Signed)
  Problem: Activity: Goal: Risk for activity intolerance will decrease Outcome: Completed/Met   Problem: Nutrition: Goal: Adequate nutrition will be maintained Outcome: Completed/Met   Problem: Coping: Goal: Level of anxiety will decrease Outcome: Completed/Met   Problem: Elimination: Goal: Will not experience complications related to urinary retention Outcome: Completed/Met   Problem: Education: Goal: Knowledge of condition will improve Outcome: Completed/Met   Problem: Activity: Goal: Will verbalize the importance of balancing activity with adequate rest periods Outcome: Completed/Met Goal: Ability to tolerate increased activity will improve Outcome: Completed/Met   Problem: Coping: Goal: Ability to identify and utilize available resources and services will improve Outcome: Completed/Met   Problem: Role Relationship: Goal: Ability to demonstrate positive interaction with newborn will improve Outcome: Completed/Met   

## 2020-06-11 NOTE — Progress Notes (Signed)
Subjective: Postpartum Day 1: Cesarean Delivery Patient reports incisional pain and tolerating PO.  Has not got out of bed because of low BP yesterday. Patient remains without dizziness from that. Incisional pain still there but improving. Feels overall fine. Foley still in place.   Objective: Vital signs in last 24 hours: Temp:  [97.8 F (36.6 C)-98.4 F (36.9 C)] 98.2 F (36.8 C) (11/19 0440) Pulse Rate:  [49-86] 51 (11/19 0440) Resp:  [10-21] 16 (11/19 0440) BP: (77-143)/(40-84) 99/45 (11/19 0440) SpO2:  [94 %-100 %] 97 % (11/19 0440)  Physical Exam:  General: alert and cooperative Lochia: appropriate Uterine Fundus: firm Incision: healing well, no significant drainage, no dehiscence, no significant erythema DVT Evaluation: No evidence of DVT seen on physical exam.  Recent Labs    06/10/20 1646 06/11/20 0536  HGB 8.8* 8.3*  HCT 25.7* 24.7*    Assessment/Plan: Status post Cesarean section c/b EBL of 1154cc for suspected large placental abruption.  hgb 5.9/2u pRBC/7.4/8.9/8.3 - stable No s/s active continued bleeding Soft Bps - pt runs low at baseline.  Given stability, d/c foley and encourage ambulation. Has only received tylenol for pain and just one oxycodone. Now that hgb stable will schedule ibuprofen.    Melinda Nichols 06/11/2020, 7:04 AM

## 2020-06-12 MED ORDER — OXYCODONE HCL 5 MG PO TABS
5.0000 mg | ORAL_TABLET | ORAL | 0 refills | Status: DC | PRN
Start: 2020-06-12 — End: 2020-12-29

## 2020-06-12 MED ORDER — IBUPROFEN 600 MG PO TABS: 600.0000 mg | ORAL_TABLET | Freq: Four times a day (QID) | ORAL | 0 refills | Status: DC | PRN

## 2020-06-12 MED ORDER — MAGNESIUM HYDROXIDE 400 MG/5ML PO SUSP
30.0000 mL | Freq: Every day | ORAL | Status: DC
  Administered 2020-06-12: 30 mL via ORAL
  Filled 2020-06-12: qty 30

## 2020-06-12 NOTE — Discharge Summary (Signed)
Postpartum Discharge Summary  Date of Service updated11/20/21     Patient Name: Melinda Nichols DOB: 1981/05/18 MRN: [id number]  Date of admission: 06/07/2020 Delivery date:06/10/2020  Delivering provider: Eyvonne Mechanic A  Date of discharge: 06/12/2020  Admitting diagnosis: Antepartum placental abruption [O45.90] Intrauterine pregnancy: [redacted]w[redacted]d    Secondary diagnosis:  Active Problems:   Antepartum placental abruption  Additional problems: placental abruption, Preterm    Discharge diagnosis: Preterm Pregnancy Delivered                                              Post partum procedures:blood transfusion Augmentation: N/A [facility]   39y.o. yo G2P0102 at 33w0das admitted to the hospital 06/07/2020 for scheduled cesarean section with the following indication:placenta abruption.Delivery details are as follows:  Membrane Rupture Time/Date: 3:04 AM ,06/10/2020   Delivery Method:C-Section, Low Transverse  Details of operation can be found in separate operative note.  Patient had an uncomplicated postpartum course.  She is ambulating, tolerating a regular diet, passing flatus, and urinating well. Patient is discharged home in stable condition on  06/12/20        Newborn Data: Birth date:06/10/2020  Birth time:3:05 AM  Gender:Female  Living status:Living  Apgars:8 ,9  Weight:2240 g     Magnesium Sulfate received: No BMZ received: Yes Rhophylac:N/A MMR:N/A T-DaP:Given prenatally Flu: N/A Transfusion:Yes  Physical exam  Vitals:   06/11/20 1658 06/11/20 1931 06/11/20 2332 06/12/20 0629  BP: (!) 100/56 (!) 95/51 117/68 110/63  Pulse: 61 (!) 58 61 (!) 59  Resp: '18 18 18 16  ' Temp: 98.1 F (36.7 C) 98.4 F (36.9 C) 98.6 F (37 C) 98.7 F (37.1 C)  TempSrc: Oral Oral Oral Oral  SpO2: 100% 97% 100% 100%  Weight:      Height:       General: alert Lochia: appropriate Uterine Fundus: firm Incision: Healing  well with no significant drainage DVT Evaluation: No evidence of DVT seen on physical exam. Labs: Lab Results  Component Value Date   WBC 11.0 (H) 06/11/2020   HGB 8.3 (L) 06/11/2020   HCT 24.7 (L) 06/11/2020   MCV 91.1 06/11/2020   PLT 178 06/11/2020   CMP Latest Ref Rng & Units 06/09/2020  Glucose 70 - 99 mg/dL 138(H)  BUN 6 - 20 mg/dL 7  Creatinine 0.44 - 1.00 mg/dL 0.65  Sodium 135 - 145 mmol/L 132(L)  Potassium 3.5 - 5.1 mmol/L 3.7  Chloride 98 - 111 mmol/L 105  CO2 22 - 32 mmol/L 21(L)  Calcium 8.9 - 10.3 mg/dL 8.3(L)  Total Protein 6.5 - 8.1 g/dL 5.7(L)  Total Bilirubin 0.3 - 1.2 mg/dL 0.2(L)  Alkaline Phos 38 - 126 U/L 93  AST 15 - 41 U/L 24  ALT 0 - 44 U/L 14   Edinburgh Score: No flowsheet data found.    After visit meds:  Allergies as of 06/12/2020   No Known Allergies     Medication List    STOP taking these medications   aspirin 81 MG EC tablet   sulfamethoxazole-trimethoprim 800-160 MG tablet Commonly known as: BACTRIM DS     TAKE these medications   acetaminophen 500 MG tablet Commonly known as: TYLENOL Take 500 mg by mouth every 6 (six) hours as needed for mild pain.   famotidine 10 MG tablet  Commonly known as: PEPCID Take 10 mg by mouth daily as needed for heartburn or indigestion.   ibuprofen 600 MG tablet Commonly known as: ADVIL Take 1 tablet (600 mg total) by mouth every 6 (six) hours as needed.   oxyCODONE 5 MG immediate release tablet Commonly known as: Roxicodone Take 1 tablet (5 mg total) by mouth every 4 (four) hours as needed for severe pain.   prenatal multivitamin Tabs tablet Take 1 tablet by mouth daily at 12 noon.   sertraline 50 MG tablet Commonly known as: ZOLOFT Take 1 tablet (50 mg total) by mouth daily.        Discharge home in stable condition Infant Feeding: unknown Infant Disposition:home with biologic mother Discharge instruction: per After Visit Summary and Postpartum booklet. Activity: Advance as  tolerated. Pelvic rest for 6 weeks.  Diet: routine diet Anticipated Birth Control: Unsure Postpartum Appointment:6 weeks Additional Postpartum F/U: 6 wks Future Appointments:No future appointments. Follow up Visit:      06/12/2020 Tyson Dense, MD

## 2020-06-12 NOTE — Progress Notes (Signed)
Teaching complete  Ambulated out with sister

## 2020-06-12 NOTE — Discharge Instructions (Signed)
Postpartum Baby Blues The postpartum period begins right after the birth of a baby. During this time, there is often a lot of joy and excitement. It is also a time of many changes in the life of the parents. No matter how many times a mother gives birth, each child brings new challenges to the family, including different ways of relating to one another. It is common to have feelings of excitement along with confusing changes in moods, emotions, and thoughts. You may feel happy one minute and sad or stressed the next. These feelings of sadness usually happen in the period right after you have your baby, and they go away within a week or two. This is called the "baby blues." What are the causes? There is no known cause of baby blues. It is likely caused by a combination of factors. However, changes in hormone levels after childbirth are believed to trigger some of the symptoms. Other factors that can play a role in these mood changes include:  Lack of sleep.  Stressful life events, such as poverty, caring for a loved one, or death of a loved one.  Genetics. What are the signs or symptoms? Symptoms of this condition include:  Brief changes in mood, such as going from extreme happiness to sadness.  Decreased concentration.  Difficulty sleeping.  Crying spells and tearfulness.  Loss of appetite.  Irritability.  Anxiety. If the symptoms of baby blues last for more than 2 weeks or become more severe, you may have postpartum depression. How is this diagnosed? This condition is diagnosed based on an evaluation of your symptoms. There are no medical or lab tests that lead to a diagnosis, but there are various questionnaires that a health care provider may use to identify women with the baby blues or postpartum depression. How is this treated? Treatment is not needed for this condition. The baby blues usually go away on their own in 1-2 weeks. Social support is often all that is needed. You will  be encouraged to get adequate sleep and rest. Follow these instructions at home: Lifestyle      Get as much rest as you can. Take a nap when the baby sleeps.  Exercise regularly as told by your health care provider. Some women find yoga and walking to be helpful.  Eat a balanced and nourishing diet. This includes plenty of fruits and vegetables, whole grains, and lean proteins.  Do little things that you enjoy. Have a cup of tea, take a bubble bath, read your favorite magazine, or listen to your favorite music.  Avoid alcohol.  Ask for help with household chores, cooking, grocery shopping, or running errands. Do not try to do everything yourself. Consider hiring a postpartum doula to help. This is a professional who specializes in providing support to new mothers.  Try not to make any major life changes during pregnancy or right after giving birth. This can add stress. General instructions  Talk to people close to you about how you are feeling. Get support from your partner, family members, friends, or other new moms. You may want to join a support group.  Find ways to cope with stress. This may include: ? Writing your thoughts and feelings in a journal. ? Spending time outside. ? Spending time with people who make you laugh.  Try to stay positive in how you think. Think about the things you are grateful for.  Take over-the-counter and prescription medicines only as told by your health care provider.    Let your health care provider know if you have any concerns.  Keep all postpartum visits as told by your health care provider. This is important. Contact a health care provider if:  Your baby blues do not go away after 2 weeks. Get help right away if:  You have thoughts of taking your own life (suicidal thoughts).  You think you may harm the baby or other people.  You see or hear things that are not there (hallucinations). Summary  After giving birth, you may feel happy  one minute and sad or stressed the next. Feelings of sadness that happen right after the baby is born and go away after a week or two are called the "baby blues."  You can manage the baby blues by getting enough rest, eating a healthy diet, exercising, spending time with supportive people, and finding ways to cope with stress.  If feelings of sadness and stress last longer than 2 weeks or get in the way of caring for your baby, talk to your health care provider. This may mean you have postpartum depression. This information is not intended to replace advice given to you by your health care provider. Make sure you discuss any questions you have with your health care provider. Document Revised: 11/01/2018 Document Reviewed: 09/05/2016 Elsevier Patient Education  2020 [place]. Postpartum Care After Cesarean Delivery This sheet gives you information about how to care for yourself from the time you deliver your baby to up to 6-12 weeks after delivery (postpartum period). Your health care provider may also give you more specific instructions. If you have problems or questions, contact your health care provider. Follow these instructions at home: Medicines  Take over-the-counter and prescription medicines only as told by your health care provider.  If you were prescribed an antibiotic medicine, take it as told by your health care provider. Do not stop taking the antibiotic even if you start to feel better.  Ask your health care provider if the medicine prescribed to you: ? Requires you to avoid driving or using heavy machinery. ? Can cause constipation. You may need to take actions to prevent or treat constipation, such as:  Drink enough fluid to keep your urine pale yellow.  Take over-the-counter or prescription medicines.  Eat foods that are high in fiber, such as beans, whole grains, and fresh fruits and vegetables.  Limit foods that are high in fat and processed sugars, such as fried or  sweet foods. Activity  Gradually return to your normal activities as told by your health care provider.  Avoid activities that take a lot of effort and energy (are strenuous) until approved by your health care provider. Walking at a slow to moderate pace is usually safe. Ask your health care provider what activities are safe for you. ? Do not lift anything that is heavier than your baby or 10 lb (4.5 kg) as told by your health care provider. ? Do not vacuum, climb stairs, or drive a car for as long as told by your health care provider.  If possible, have someone help you at home until you are able to do your usual activities yourself.  Rest as much as possible. Try to rest or take naps while your baby is sleeping. Vaginal bleeding  It is normal to have vaginal bleeding (lochia) after delivery. Wear a sanitary pad to absorb vaginal bleeding and discharge. ? During the first week after delivery, the amount and appearance of lochia is often similar to a menstrual  period. ? Over the next few weeks, it will gradually decrease to a dry, yellow-brown discharge. ? For most women, lochia stops completely by 4-6 weeks after delivery. Vaginal bleeding can vary from woman to woman.  Change your sanitary pads frequently. Watch for any changes in your flow, such as: ? A sudden increase in volume. ? A change in color. ? Large blood clots.  If you pass a blood clot, save it and call your health care provider to discuss. Do not flush blood clots down the toilet before you get instructions from your health care provider.  Do not use tampons or douches until your health care provider says this is safe.  If you are not breastfeeding, your period should return 6-8 weeks after delivery. If you are breastfeeding, your period may return anytime between 8 weeks after delivery and the time that you stop breastfeeding. Perineal care   If your C-section (Cesarean section) was unplanned, and you were allowed to  labor and push before delivery, you may have pain, swelling, and discomfort of the tissue between your vaginal opening and your anus (perineum). You may also have an incision in the tissue (episiotomy) or the tissue may have torn during delivery. Follow these instructions as told by your health care provider: ? Keep your perineum clean and dry as told by your health care provider. Use medicated pads and pain-relieving sprays and creams as directed. ? If you have an episiotomy or vaginal tear, check the area every day for signs of infection. Check for:  Redness, swelling, or pain.  Fluid or blood.  Warmth.  Pus or a bad smell. ? You may be given a squirt bottle to use instead of wiping to clean the perineum area after you go to the bathroom. As you start healing, you may use the squirt bottle before wiping yourself. Make sure to wipe gently. ? To relieve pain caused by an episiotomy, vaginal tear, or hemorrhoids, try taking a warm sitz bath 2-3 times a day. A sitz bath is a warm water bath that is taken while you are sitting down. The water should only come up to your hips and should cover your buttocks. Breast care  Within the first few days after delivery, your breasts may feel heavy, full, and uncomfortable (breast engorgement). You may also have milk leaking from your breasts. Your health care provider can suggest ways to help relieve breast discomfort. Breast engorgement should go away within a few days.  If you are breastfeeding: ? Wear a bra that supports your breasts and fits you well. ? Keep your nipples clean and dry. Apply creams and ointments as told by your health care provider. ? You may need to use breast pads to absorb milk leakage. ? You may have uterine contractions every time you breastfeed for several weeks after delivery. Uterine contractions help your uterus return to its normal size. ? If you have any problems with breastfeeding, work with your health care provider or a  Science writer.  If you are not breastfeeding: ? Avoid touching your breasts as this can make your breasts produce more milk. ? Wear a well-fitting bra and use cold packs to help with swelling. ? Do not squeeze out (express) milk. This causes you to make more milk. Intimacy and sexuality  Ask your health care provider when you can engage in sexual activity. This may depend on your: ? Risk of infection. ? Healing rate. ? Comfort and desire to engage in sexual activity.  You are able to get pregnant after delivery, even if you have not had your period. If desired, talk with your health care provider about methods of family planning or birth control (contraception). Lifestyle  Do not use any products that contain nicotine or tobacco, such as cigarettes, e-cigarettes, and chewing tobacco. If you need help quitting, ask your health care provider.  Do not drink alcohol, especially if you are breastfeeding. Eating and drinking   Drink enough fluid to keep your urine pale yellow.  Eat high-fiber foods every day. These may help prevent or relieve constipation. High-fiber foods include: ? Whole grain cereals and breads. ? Brown rice. ? Beans. ? Fresh fruits and vegetables.  Take your prenatal vitamins until your postpartum checkup or until your health care provider tells you it is okay to stop. General instructions  Keep all follow-up visits for you and your baby as told by your health care provider. Most women visit their health care provider for a postpartum checkup within the first 3-6 weeks after delivery. Contact a health care provider if you:  Feel unable to cope with the changes that a new baby brings to your life, and these feelings do not go away.  Feel unusually sad or worried.  Have breasts that are painful, hard, or turn red.  Have a fever.  Have trouble holding urine or keeping urine from leaking.  Have little or no interest in activities you used to  enjoy.  Have not breastfed at all and you have not had a menstrual period for 12 weeks after delivery.  Have stopped breastfeeding and you have not had a menstrual period for 12 weeks after you stopped breastfeeding.  Have questions about caring for yourself or your baby.  Pass a blood clot from your vagina. Get help right away if you:  Have chest pain.  Have difficulty breathing.  Have sudden, severe leg pain.  Have severe pain or cramping in your abdomen.  Bleed from your vagina so much that you fill more than one sanitary pad in one hour. Bleeding should not be heavier than your heaviest period.  Develop a severe headache.  Faint.  Have blurred vision or spots in your vision.  Have a bad-smelling vaginal discharge.  Have thoughts about hurting yourself or your baby. If you ever feel like you may hurt yourself or others, or have thoughts about taking your own life, get help right away. You can go to your nearest emergency department or call:  Your local emergency services (911 in the [place]  A suicide crisis helpline, such as the National Suicide Prevention Lifeline at 865-576-4786. This is open 24 hours a day. Summary  The period of time from when you deliver your baby to up to 6-12 weeks after delivery is called the postpartum period.  Gradually return to your normal activities as told by your health care provider.  Keep all follow-up visits for you and your baby as told by your health care provider. This information is not intended to replace advice given to you by your health care provider. Make sure you discuss any questions you have with your health care provider. Document Revised: 02/27/2018 Document Reviewed: 02/27/2018 Elsevier Patient Education  2020 [employer]. Postpartum Baby Blues The postpartum period begins right after the birth of a baby. During this time, there is often a lot of joy and excitement. It is also a time of many changes in the life of  the parents. No matter how many times a  mother gives birth, each child brings new challenges to the family, including different ways of relating to one another. It is common to have feelings of excitement along with confusing changes in moods, emotions, and thoughts. You may feel happy one minute and sad or stressed the next. These feelings of sadness usually happen in the period right after you have your baby, and they go away within a week or two. This is called the "baby blues." What are the causes? There is no known cause of baby blues. It is likely caused by a combination of factors. However, changes in hormone levels after childbirth are believed to trigger some of the symptoms. Other factors that can play a role in these mood changes include:  Lack of sleep.  Stressful life events, such as poverty, caring for a loved one, or death of a loved one.  Genetics. What are the signs or symptoms? Symptoms of this condition include:  Brief changes in mood, such as going from extreme happiness to sadness.  Decreased concentration.  Difficulty sleeping.  Crying spells and tearfulness.  Loss of appetite.  Irritability.  Anxiety. If the symptoms of baby blues last for more than 2 weeks or become more severe, you may have postpartum depression. How is this diagnosed? This condition is diagnosed based on an evaluation of your symptoms. There are no medical or lab tests that lead to a diagnosis, but there are various questionnaires that a health care provider may use to identify women with the baby blues or postpartum depression. How is this treated? Treatment is not needed for this condition. The baby blues usually go away on their own in 1-2 weeks. Social support is often all that is needed. You will be encouraged to get adequate sleep and rest. Follow these instructions at home: Lifestyle      Get as much rest as you can. Take a nap when the baby sleeps.  Exercise regularly as told  by your health care provider. Some women find yoga and walking to be helpful.  Eat a balanced and nourishing diet. This includes plenty of fruits and vegetables, whole grains, and lean proteins.  Do little things that you enjoy. Have a cup of tea, take a bubble bath, read your favorite magazine, or listen to your favorite music.  Avoid alcohol.  Ask for help with household chores, cooking, grocery shopping, or running errands. Do not try to do everything yourself. Consider hiring a postpartum doula to help. This is a professional who specializes in providing support to new mothers.  Try not to make any major life changes during pregnancy or right after giving birth. This can add stress. General instructions  Talk to people close to you about how you are feeling. Get support from your partner, family members, friends, or other new moms. You may want to join a support group.  Find ways to cope with stress. This may include: ? Writing your thoughts and feelings in a journal. ? Spending time outside. ? Spending time with people who make you laugh.  Try to stay positive in how you think. Think about the things you are grateful for.  Take over-the-counter and prescription medicines only as told by your health care provider.  Let your health care provider know if you have any concerns.  Keep all postpartum visits as told by your health care provider. This is important. Contact a health care provider if:  Your baby blues do not go away after 2 weeks. Get help right  away if:  You have thoughts of taking your own life (suicidal thoughts).  You think you may harm the baby or other people.  You see or hear things that are not there (hallucinations). Summary  After giving birth, you may feel happy one minute and sad or stressed the next. Feelings of sadness that happen right after the baby is born and go away after a week or two are called the "baby blues."  You can manage the baby blues  by getting enough rest, eating a healthy diet, exercising, spending time with supportive people, and finding ways to cope with stress.  If feelings of sadness and stress last longer than 2 weeks or get in the way of caring for your baby, talk to your health care provider. This may mean you have postpartum depression. This information is not intended to replace advice given to you by your health care provider. Make sure you discuss any questions you have with your health care provider. Document Revised: 11/01/2018 Document Reviewed: 09/05/2016 Elsevier Patient Education  2020 [employer].

## 2020-06-14 LAB — SURGICAL PATHOLOGY

## 2020-07-15 ENCOUNTER — Inpatient Hospital Stay (HOSPITAL_COMMUNITY): Admit: 2020-07-15 | Payer: 59 | Admitting: Obstetrics & Gynecology

## 2020-12-29 ENCOUNTER — Encounter: Payer: Self-pay | Admitting: Family Medicine

## 2020-12-29 ENCOUNTER — Other Ambulatory Visit: Payer: Self-pay

## 2020-12-29 ENCOUNTER — Ambulatory Visit: Payer: 59 | Admitting: Family Medicine

## 2020-12-29 VITALS — BP 104/68 | HR 54 | Temp 98.5°F | Ht 61.0 in | Wt 104.0 lb

## 2020-12-29 DIAGNOSIS — R002 Palpitations: Secondary | ICD-10-CM | POA: Diagnosis not present

## 2020-12-29 NOTE — Progress Notes (Signed)
Chief Complaint  Patient presents with  . Palpitations    Melinda Nichols is a 40 y.o. female here for palpitations.  Length of issue: 3 weeks How long does it last: several hours Light headedness/passing out? No Chest pain/shortness of breath? No CP, some sob Hx of arrhythmia? No  Daughter and maternal aunt had hx of a fib. Medication changes/illicit substances? No No alcohol/caffeine change.   Past Medical History:  Diagnosis Date  . GAD (generalized anxiety disorder)    Past Surgical History:  Procedure Laterality Date  . ABDOMINAL SURGERY    . CESAREAN SECTION  2003  . CESAREAN SECTION N/A 06/10/2020   Procedure: REPEAT CESAREAN SECTION EDC: 07-22-20 PREVIOUS X 1  ALLERG: NKDA;  Surgeon: Lyn Henri, MD;  Location: [facility];  Service: Obstetrics;  Laterality: N/A;   No Known Allergies Allergies as of 12/29/2020   No Known Allergies     Medication List       Accurate as of December 29, 2020  4:08 PM. If you have any questions, ask your nurse or doctor.        STOP taking these medications   acetaminophen 500 MG tablet Commonly known as: TYLENOL Stopped by: Sharlene Dory, DO   famotidine 10 MG tablet Commonly known as: PEPCID Stopped by: Sharlene Dory, DO   ibuprofen 600 MG tablet Commonly known as: ADVIL Stopped by: Sharlene Dory, DO   oxyCODONE 5 MG immediate release tablet Commonly known as: Roxicodone Stopped by: Sharlene Dory, DO   sertraline 50 MG tablet Commonly known as: ZOLOFT Stopped by: Sharlene Dory, DO     TAKE these medications   prenatal multivitamin Tabs tablet Take 1 tablet by mouth daily at 12 noon.       BP 104/68 (BP Location: Left Arm, Patient Position: Sitting, Cuff Size: Normal)   Pulse (!) 54   Temp 98.5 F (36.9 C) (Oral)   Ht 5\' 1"  (1.549 m)   Wt 104 lb (47.2 kg)   SpO2 98%   BMI 19.65 kg/m  Gen: Awake, alert, appearing stated age Heart: bradycardic, reg rhythm, no  bruits, no LE edema Lungs: CTAB, no accessory muscle use Neuro: No cerebellar signs MSK: No muscle group atrophy or asymmetry Psych: Age appropriate judgment and insight, mood/affect WNL  Palpitations - Plan: TSH, CBC, Comprehensive metabolic panel, Magnesium, Ambulatory referral to Cardiology, EKG 12-Lead  New problem, uncertain prognosis. EKG shows NS bradycardia, normal axis, no interval abnormalities, no ST segment or T wave changes, good R wave progression.  Ck labs, refer to cards for possible longer term monitor. Stay hydrated. Activity as tolerated until evaluated by cards.  Orders as above. The patient voiced understanding and agreement to the plan.  Tirzah Fross 4:08 PM 12/29/20

## 2020-12-29 NOTE — Patient Instructions (Signed)
Give us 2-3 business days to get the results of your labs back.  ° °Stay hydrated. ° °If you do not hear anything about your referral in the next 1-2 weeks, call our office and ask for an update. ° °Let us know if you need anything. °

## 2020-12-30 LAB — CBC
HCT: 27.1 % — ABNORMAL LOW (ref 36.0–46.0)
Hemoglobin: 8.5 g/dL — ABNORMAL LOW (ref 12.0–15.0)
MCHC: 31.5 g/dL (ref 30.0–36.0)
MCV: 74.6 fl — ABNORMAL LOW (ref 78.0–100.0)
Platelets: 353 10*3/uL (ref 150.0–400.0)
RBC: 3.63 Mil/uL — ABNORMAL LOW (ref 3.87–5.11)
RDW: 19.2 % — ABNORMAL HIGH (ref 11.5–15.5)
WBC: 6.5 10*3/uL (ref 4.0–10.5)

## 2020-12-30 LAB — COMPREHENSIVE METABOLIC PANEL
ALT: 20 U/L (ref 0–35)
AST: 22 U/L (ref 0–37)
Albumin: 4.2 g/dL (ref 3.5–5.2)
Alkaline Phosphatase: 78 U/L (ref 39–117)
BUN: 13 mg/dL (ref 6–23)
CO2: 28 mEq/L (ref 19–32)
Calcium: 9.4 mg/dL (ref 8.4–10.5)
Chloride: 103 mEq/L (ref 96–112)
Creatinine, Ser: 0.75 mg/dL (ref 0.40–1.20)
GFR: 98.92 mL/min (ref >60.00)
Glucose, Bld: 87 mg/dL (ref 70–99)
Potassium: 4.4 mEq/L (ref 3.5–5.1)
Sodium: 138 mEq/L (ref 135–145)
Total Bilirubin: 0.3 mg/dL (ref 0.2–1.2)
Total Protein: 7 g/dL (ref 6.0–8.3)

## 2020-12-30 LAB — TSH: TSH: 1.3 u[IU]/mL (ref 0.35–4.50)

## 2020-12-30 LAB — MAGNESIUM: Magnesium: 2 mg/dL (ref 1.5–2.5)

## 2021-01-03 ENCOUNTER — Other Ambulatory Visit: Payer: 59

## 2021-01-03 ENCOUNTER — Other Ambulatory Visit: Payer: Self-pay | Admitting: *Deleted

## 2021-01-03 DIAGNOSIS — D649 Anemia, unspecified: Secondary | ICD-10-CM

## 2021-01-04 ENCOUNTER — Other Ambulatory Visit: Payer: Self-pay | Admitting: Family Medicine

## 2021-01-04 DIAGNOSIS — E611 Iron deficiency: Secondary | ICD-10-CM

## 2021-01-04 LAB — IRON,TIBC AND FERRITIN PANEL
%SAT: 3 % (calc) — ABNORMAL LOW (ref 16–45)
Ferritin: 5 ng/mL — ABNORMAL LOW (ref 16–232)
Iron: 14 ug/dL — ABNORMAL LOW (ref 40–190)
TIBC: 445 mcg/dL (calc) (ref 250–450)

## 2021-02-03 ENCOUNTER — Other Ambulatory Visit: Payer: 59

## 2021-02-03 NOTE — Addendum Note (Signed)
Addended by: Rosita Kea on: 02/03/2021 07:42 AM   Modules accepted: Orders

## 2021-02-03 NOTE — Addendum Note (Signed)
Addended by: Rosita Kea on: 02/03/2021 07:47 AM   Modules accepted: Orders

## 2021-02-03 NOTE — Addendum Note (Signed)
Addended by: Rosita Kea on: 02/03/2021 07:40 AM   Modules accepted: Orders

## 2021-02-03 NOTE — Addendum Note (Signed)
Addended by: Rosita Kea on: 02/03/2021 07:41 AM   Modules accepted: Orders

## 2021-02-04 NOTE — Addendum Note (Signed)
Addended by: Rosita Kea on: 02/04/2021 11:09 AM   Modules accepted: Orders

## 2021-03-23 ENCOUNTER — Encounter: Payer: Self-pay | Admitting: Family Medicine

## 2021-03-23 ENCOUNTER — Other Ambulatory Visit: Payer: Self-pay

## 2021-03-23 ENCOUNTER — Other Ambulatory Visit: Payer: Self-pay | Admitting: *Deleted

## 2021-03-23 ENCOUNTER — Ambulatory Visit: Payer: 59 | Admitting: Family Medicine

## 2021-03-23 ENCOUNTER — Ambulatory Visit: Payer: Self-pay

## 2021-03-23 VITALS — BP 108/70 | HR 64 | Ht 61.0 in | Wt 104.0 lb

## 2021-03-23 DIAGNOSIS — D508 Other iron deficiency anemias: Secondary | ICD-10-CM | POA: Diagnosis not present

## 2021-03-23 DIAGNOSIS — M79662 Pain in left lower leg: Secondary | ICD-10-CM | POA: Insufficient documentation

## 2021-03-23 DIAGNOSIS — D509 Iron deficiency anemia, unspecified: Secondary | ICD-10-CM | POA: Insufficient documentation

## 2021-03-23 HISTORY — DX: Pain in left lower leg: M79.662

## 2021-03-23 HISTORY — DX: Iron deficiency anemia, unspecified: D50.9

## 2021-03-23 NOTE — Patient Instructions (Addendum)
Calf exercises Heel Lift  [place] [facility] in house Iron infusion-[address] See me again in 4-6 weeks

## 2021-03-23 NOTE — Progress Notes (Signed)
[facility] [address] [place] [address] Phone: [phone] Subjective:   Melinda Nichols, am serving as a scribe for Dr. Antoine Primas.  This visit occurred during the SARS-CoV-2 public health emergency.  Safety protocols were in place, including screening questions prior to the visit, additional usage of staff PPE, and extensive cleaning of exam room while observing appropriate contact time as indicated for disinfecting solutions.   I'm seeing this patient by the request  of:  Sharlene Dory, DO  CC: Left calf pain  [id number]  Melinda [facility] is a 40 y.o. female coming in with complaint of L calf pain. Patient states that in past 2 weeks she has developed pain in L calf-musculotendinous junction. Pain is worse at beginning of her runs that improves throughout the run. Is running marathon in 3 weeks. No change in footwear. Did just get new shoes: [employer]. Also has HOKA. Has not changed mileage or route surfaces. Pain is achy. Has tried ice and foam rolling.       \Reviewed patient's most recent labs in June and was found to have significantly low iron with a ferritin of 5 Hemoglobin is low at 8.5 it appears to be patient is at baseline.  MCV though is significantly lower at 74.  Past Medical History:  Diagnosis Date   GAD (generalized anxiety disorder)    Past Surgical History:  Procedure Laterality Date   ABDOMINAL SURGERY     CESAREAN SECTION  2003   CESAREAN SECTION N/A 06/10/2020   Procedure: REPEAT CESAREAN SECTION EDC: 07-22-20 PREVIOUS X 1  ALLERG: NKDA;  Surgeon: Lyn Henri, MD;  Location: [facility];  Service: Obstetrics;  Laterality: N/A;   Social History   Socioeconomic History   Marital status: Divorced    Spouse name: Not on file   Number of children: Not on file   Years of education: Not on file   Highest education level: Not on file  Occupational History   Not on file  Tobacco Use   Smoking  status: Never   Smokeless tobacco: Never  Substance and Sexual Activity   Alcohol use: No   Drug use: No   Sexual activity: Never  Other Topics Concern   Not on file  Social History Narrative   Not on file   Social Determinants of Health   Financial Resource Strain: Not on file  Food Insecurity: Not on file  Transportation Needs: Not on file  Physical Activity: Not on file  Stress: Not on file  Social Connections: Not on file   No Known Allergies Family History  Problem Relation Age of Onset   Bipolar disorder Mother    Bipolar disorder Father    Cancer Sister        endometrial cancer   Bipolar disorder Brother    Heart disease Daughter    Asthma Daughter          Current Outpatient Medications (Other):    Prenatal Vit-Fe Fumarate-FA (PRENATAL MULTIVITAMIN) TABS tablet, Take 1 tablet by mouth daily at 12 noon.   Reviewed prior external information including notes and imaging from  primary care provider As well as notes that were available from care everywhere and other healthcare systems.  Past medical history, social, surgical and family history all reviewed in electronic medical record.  No pertanent information unless stated regarding to the chief complaint.   Review of Systems:  No headache, visual changes, nausea, vomiting, diarrhea, constipation, dizziness,  abdominal pain, skin rash, fevers, chills, night sweats, weight loss, swollen lymph nodes, body aches, joint swelling, chest pain, shortness of breath, mood changes. POSITIVE muscle aches  Objective  Blood pressure 108/70, pulse 64, height 5\' 1"  (1.549 m), weight 104 lb (47.2 kg), SpO2 98 %, unknown if currently breastfeeding.   General: No apparent distress alert and oriented x3 mood and affect normal, dressed appropriately.  HEENT: Pupils equal, extraocular movements intact  Respiratory: Patient's speak in full sentences and does not appear short of breath  Cardiovascular: No lower extremity edema,  non tender, no erythema  Gait normal with good balance and coordination.  MSK: Left calf has no significant tenderness on exam.  Full flexion and extension noted.  Negative straight leg test.  No true masses appreciated.  No significant swelling or In inspection compared to the contralateral side.  Limited muscular skeletal ultrasound was performed and interpreted by , M  Limited ultrasound shows the patient does have some fascial irritation in the area where patient does feel like when she has the discomfort.  Possibly some mild dehydration also noted. Impression: Fascial injury to the calf with potential dehydration.   Impression and Recommendations:     The above documentation has been reviewed and is accurate and complete Antoine Primas, DO

## 2021-03-23 NOTE — Assessment & Plan Note (Signed)
Patient does have what appears to be myofascial injury.  Patient likely does have significant discomfort noted.  Discussed icing regimen and home exercises.  Discussed compression, heel lift.  Discussed proper hydration.  Do feel that unfortunately patient's iron deficiency could be contributing as well.  We will send patient also for a iron infusion.  Follow-up with me again in 4 weeks

## 2021-03-23 NOTE — Assessment & Plan Note (Signed)
Patient noted this after a placental abruption.  Patient unfortunately ferritin was only 5.  Patient was having what appears to be palpitations also that is concerning especially with patient wanting to do more endurance.  Patient is adamant she would like to try to continue the marathon so I do feel that an injection would be beneficial with patient having difficulty with oral supplementation.  We will try to refer patient and see how she does.  Follow-up with me again in 4 weeks.

## 2021-03-24 ENCOUNTER — Telehealth: Payer: Self-pay | Admitting: Pharmacy Technician

## 2021-03-24 ENCOUNTER — Other Ambulatory Visit: Payer: Self-pay | Admitting: Pharmacy Technician

## 2021-03-24 NOTE — Telephone Encounter (Signed)
Dr. Carolin Sicks Submission: Duncan Dull - DENIED Payer: [facility] Medication & CPT/J Code(s) submitted: Feraheme (ferumoxytol) [phone] Route of submission (phone, fax, portal): PORTAL  Feraheme denied due to patient has not tried and or failed step therapy of Infed Venofer Ferrliceit.  Would you like to use Venofer?? Please advise?

## 2021-03-30 ENCOUNTER — Other Ambulatory Visit: Payer: Self-pay | Admitting: *Deleted

## 2021-03-30 ENCOUNTER — Encounter: Payer: Self-pay | Admitting: Family Medicine

## 2021-03-30 ENCOUNTER — Other Ambulatory Visit: Payer: Self-pay

## 2021-03-30 ENCOUNTER — Ambulatory Visit (INDEPENDENT_AMBULATORY_CARE_PROVIDER_SITE_OTHER): Payer: 59 | Admitting: *Deleted

## 2021-03-30 VITALS — BP 112/71 | HR 46 | Temp 98.0°F | Resp 16

## 2021-03-30 DIAGNOSIS — D509 Iron deficiency anemia, unspecified: Secondary | ICD-10-CM

## 2021-03-30 MED ORDER — ALBUTEROL SULFATE HFA 108 (90 BASE) MCG/ACT IN AERS: 2.0000 | INHALATION_SPRAY | Freq: Once | RESPIRATORY_TRACT | Status: DC | PRN

## 2021-03-30 MED ORDER — SODIUM CHLORIDE 0.9 % IV SOLN
200.0000 mg | Freq: Once | INTRAVENOUS | Status: AC
  Administered 2021-03-30: 200 mg via INTRAVENOUS
  Filled 2021-03-30: qty 10

## 2021-03-30 MED ORDER — SODIUM CHLORIDE 0.9 % IV SOLN: Freq: Once | INTRAVENOUS | Status: DC | PRN

## 2021-03-30 MED ORDER — DIPHENHYDRAMINE HCL 25 MG PO CAPS
50.0000 mg | ORAL_CAPSULE | Freq: Once | ORAL | Status: AC
  Administered 2021-03-30: 50 mg via ORAL
  Filled 2021-03-30: qty 2

## 2021-03-30 MED ORDER — DIPHENHYDRAMINE HCL 50 MG/ML IJ SOLN: 50.0000 mg | Freq: Once | INTRAMUSCULAR | Status: DC | PRN

## 2021-03-30 MED ORDER — FAMOTIDINE IN NACL 20-0.9 MG/50ML-% IV SOLN: 20.0000 mg | Freq: Once | INTRAVENOUS | Status: DC | PRN

## 2021-03-30 MED ORDER — METHYLPREDNISOLONE SODIUM SUCC 125 MG IJ SOLR: 125.0000 mg | Freq: Once | INTRAMUSCULAR | Status: DC | PRN

## 2021-03-30 MED ORDER — ACETAMINOPHEN 325 MG PO TABS
650.0000 mg | ORAL_TABLET | Freq: Once | ORAL | Status: AC
  Administered 2021-03-30: 650 mg via ORAL
  Filled 2021-03-30: qty 2

## 2021-03-30 MED ORDER — EPINEPHRINE 0.3 MG/0.3ML IJ SOAJ: 0.3000 mg | Freq: Once | INTRAMUSCULAR | Status: DC | PRN

## 2021-03-30 NOTE — Progress Notes (Signed)
Diagnosis: Iron Deficiency Anemia  Provider:  Chilton Greathouse, MD  Procedure: Infusion  IV Type: Peripheral, IV Location: R Antecubital  Venofer (Iron Sucrose), Dose: 200 mg  Infusion Start Time: 0849am  Infusion Stop Time: 0917am  Post Infusion IV Care: Observation period completed and Peripheral IV Discontinued  Discharge: Condition: Good, Destination: Home . AVS provided to patient.   Performed by:  Forrest Moron, RN

## 2021-04-01 ENCOUNTER — Ambulatory Visit (INDEPENDENT_AMBULATORY_CARE_PROVIDER_SITE_OTHER): Payer: 59 | Admitting: *Deleted

## 2021-04-01 ENCOUNTER — Other Ambulatory Visit: Payer: Self-pay

## 2021-04-01 VITALS — BP 103/66 | HR 48 | Temp 98.1°F | Resp 16

## 2021-04-01 DIAGNOSIS — D509 Iron deficiency anemia, unspecified: Secondary | ICD-10-CM

## 2021-04-01 MED ORDER — METHYLPREDNISOLONE SODIUM SUCC 125 MG IJ SOLR: 125.0000 mg | Freq: Once | INTRAMUSCULAR | Status: DC | PRN

## 2021-04-01 MED ORDER — HEPARIN SOD (PORK) LOCK FLUSH 100 UNIT/ML IV SOLN: 500.0000 [IU] | Freq: Once | INTRAVENOUS | Status: DC | PRN

## 2021-04-01 MED ORDER — SODIUM CHLORIDE 0.9% FLUSH: 10.0000 mL | Freq: Once | INTRAVENOUS | Status: DC | PRN

## 2021-04-01 MED ORDER — DIPHENHYDRAMINE HCL 25 MG PO CAPS: 50.0000 mg | ORAL_CAPSULE | Freq: Once | ORAL | Status: DC

## 2021-04-01 MED ORDER — SODIUM CHLORIDE 0.9 % IV SOLN: Freq: Once | INTRAVENOUS | Status: DC | PRN

## 2021-04-01 MED ORDER — DIPHENHYDRAMINE HCL 50 MG/ML IJ SOLN: 50.0000 mg | Freq: Once | INTRAMUSCULAR | Status: DC | PRN

## 2021-04-01 MED ORDER — FAMOTIDINE IN NACL 20-0.9 MG/50ML-% IV SOLN: 20.0000 mg | Freq: Once | INTRAVENOUS | Status: DC | PRN

## 2021-04-01 MED ORDER — ALBUTEROL SULFATE HFA 108 (90 BASE) MCG/ACT IN AERS: 2.0000 | INHALATION_SPRAY | Freq: Once | RESPIRATORY_TRACT | Status: DC | PRN

## 2021-04-01 MED ORDER — ANTICOAGULANT SODIUM CITRATE 4% (200MG/5ML) IV SOLN
5.0000 mL | Freq: Once | Status: DC | PRN
  Filled 2021-04-01: qty 5

## 2021-04-01 MED ORDER — EPINEPHRINE 0.3 MG/0.3ML IJ SOAJ: 0.3000 mg | Freq: Once | INTRAMUSCULAR | Status: DC | PRN

## 2021-04-01 MED ORDER — SODIUM CHLORIDE 0.9 % IV SOLN
200.0000 mg | Freq: Once | INTRAVENOUS | Status: AC
  Administered 2021-04-01: 200 mg via INTRAVENOUS
  Filled 2021-04-01: qty 10

## 2021-04-01 MED ORDER — ACETAMINOPHEN 325 MG PO TABS
650.0000 mg | ORAL_TABLET | Freq: Once | ORAL | Status: AC
  Administered 2021-04-01: 650 mg via ORAL
  Filled 2021-04-01: qty 2

## 2021-04-01 MED ORDER — SODIUM CHLORIDE 0.9% FLUSH: 3.0000 mL | Freq: Once | INTRAVENOUS | Status: DC | PRN

## 2021-04-01 MED ORDER — ALTEPLASE 2 MG IJ SOLR: 2.0000 mg | Freq: Once | INTRAMUSCULAR | Status: DC | PRN

## 2021-04-01 MED ORDER — HEPARIN SOD (PORK) LOCK FLUSH 100 UNIT/ML IV SOLN: 250.0000 [IU] | Freq: Once | INTRAVENOUS | Status: DC | PRN

## 2021-04-01 NOTE — Progress Notes (Signed)
Diagnosis: Iron Deficiency Anemia  Provider:  Chilton Greathouse, MD  Procedure: Infusion  IV Type: Peripheral, IV Location: R Antecubital  Venofer (Iron Sucrose), Dose: 200 mg  Infusion Start Time: 0916am  Infusion Stop Time: 0948am  Post Infusion IV Care: Observation period completed and Peripheral IV Discontinued  Discharge: Condition: Good, Destination: Home . AVS provided to patient.   Performed by:  Forrest Moron, RN

## 2021-04-04 ENCOUNTER — Ambulatory Visit (INDEPENDENT_AMBULATORY_CARE_PROVIDER_SITE_OTHER): Payer: 59

## 2021-04-04 ENCOUNTER — Other Ambulatory Visit: Payer: Self-pay

## 2021-04-04 VITALS — BP 114/58 | HR 53 | Temp 98.0°F | Resp 16

## 2021-04-04 DIAGNOSIS — D509 Iron deficiency anemia, unspecified: Secondary | ICD-10-CM | POA: Diagnosis not present

## 2021-04-04 MED ORDER — DIPHENHYDRAMINE HCL 25 MG PO CAPS: 50.0000 mg | ORAL_CAPSULE | Freq: Once | ORAL | Status: DC

## 2021-04-04 MED ORDER — SODIUM CHLORIDE 0.9 % IV SOLN
200.0000 mg | Freq: Once | INTRAVENOUS | Status: AC
  Administered 2021-04-04: 200 mg via INTRAVENOUS
  Filled 2021-04-04: qty 10

## 2021-04-04 MED ORDER — ACETAMINOPHEN 325 MG PO TABS: 650.0000 mg | ORAL_TABLET | Freq: Once | ORAL | Status: DC

## 2021-04-04 NOTE — Progress Notes (Signed)
  Diagnosis: Iron Deficiency Anemia  Provider:  Chilton Greathouse, MD  Procedure: Infusion  IV Type: Peripheral, IV Location: L Antecubital  Venofer (Iron Sucrose), Dose: 200 mg  Infusion Start Time: 0903  Infusion Stop Time: 0918  Post Infusion IV Care: Observation period completed  Discharge: Condition: Good, Destination: Home . AVS provided to patient.   Performed by:  Vontrell Pullman, Lyman Speller, LPN

## 2021-04-06 ENCOUNTER — Ambulatory Visit (INDEPENDENT_AMBULATORY_CARE_PROVIDER_SITE_OTHER): Payer: 59

## 2021-04-06 ENCOUNTER — Other Ambulatory Visit: Payer: Self-pay

## 2021-04-06 VITALS — BP 106/64 | HR 64 | Temp 97.9°F | Resp 18

## 2021-04-06 DIAGNOSIS — D509 Iron deficiency anemia, unspecified: Secondary | ICD-10-CM | POA: Diagnosis not present

## 2021-04-06 MED ORDER — DIPHENHYDRAMINE HCL 25 MG PO CAPS: 50.0000 mg | ORAL_CAPSULE | Freq: Once | ORAL | Status: DC

## 2021-04-06 MED ORDER — SODIUM CHLORIDE 0.9 % IV SOLN
200.0000 mg | Freq: Once | INTRAVENOUS | Status: AC
  Administered 2021-04-06: 200 mg via INTRAVENOUS
  Filled 2021-04-06: qty 10

## 2021-04-06 MED ORDER — ACETAMINOPHEN 325 MG PO TABS: 650.0000 mg | ORAL_TABLET | Freq: Once | ORAL | Status: DC

## 2021-04-06 NOTE — Progress Notes (Signed)
Diagnosis: Iron Deficiency Anemia  Provider:  Chilton Greathouse, MD  Procedure: Infusion  IV Type: Peripheral, IV Location: L Antecubital  Venofer (Iron Sucrose), Dose: 200 mg  Infusion Start Time: 0851 04/06/2021  Infusion Stop Time: 0910  Post Infusion IV Care: Peripheral IV Discontinued  Discharge: Condition: Good, Destination: Home . AVS provided to patient.   Performed by:  Trishia Cuthrell, Lyman Speller, LPN

## 2021-04-08 ENCOUNTER — Ambulatory Visit: Payer: 59

## 2021-04-11 ENCOUNTER — Ambulatory Visit: Payer: 59

## 2021-04-14 ENCOUNTER — Other Ambulatory Visit: Payer: Self-pay

## 2021-04-14 ENCOUNTER — Ambulatory Visit (INDEPENDENT_AMBULATORY_CARE_PROVIDER_SITE_OTHER): Payer: 59

## 2021-04-14 VITALS — BP 107/67 | HR 65 | Temp 97.8°F | Resp 16 | Wt 105.8 lb

## 2021-04-14 DIAGNOSIS — D509 Iron deficiency anemia, unspecified: Secondary | ICD-10-CM | POA: Diagnosis not present

## 2021-04-14 MED ORDER — ACETAMINOPHEN 325 MG PO TABS: 650.0000 mg | ORAL_TABLET | Freq: Once | ORAL | Status: DC

## 2021-04-14 MED ORDER — DIPHENHYDRAMINE HCL 25 MG PO CAPS: 50.0000 mg | ORAL_CAPSULE | Freq: Once | ORAL | Status: DC

## 2021-04-14 MED ORDER — SODIUM CHLORIDE 0.9 % IV SOLN
200.0000 mg | Freq: Once | INTRAVENOUS | Status: AC
  Administered 2021-04-14: 200 mg via INTRAVENOUS
  Filled 2021-04-14: qty 10

## 2021-04-14 NOTE — Progress Notes (Signed)
Diagnosis: Iron Deficiency Anemia  Provider:  Chilton Greathouse, MD  Procedure: Infusion  IV Type: Peripheral, IV Location: L Forearm  Venofer (Iron Sucrose), Dose: 200 mg  Infusion Start Time: 08.45 04/14/2021  Infusion Stop Time: 0909 04/14/2021  Post Infusion IV Care: Peripheral IV Discontinued  Discharge: Condition: Good, Destination: Home . AVS provided to patient.   Performed by:  Garnette Czech, RN

## 2021-04-19 ENCOUNTER — Other Ambulatory Visit: Payer: Self-pay | Admitting: Pharmacy Technician

## 2021-04-20 NOTE — Progress Notes (Signed)
[facility] [address] [place] [address] Phone: [phone] Subjective:    I'm seeing this patient by the request  of:  Melinda Dory, DO  CC: Left calf pain follow-up  [id number]  03/23/2021 Patient noted this after a placental abruption.  Patient unfortunately ferritin was only 5.  Patient was having what appears to be palpitations also that is concerning especially with patient wanting to do more endurance.  Patient is adamant she would like to try to continue the marathon so I do feel that an injection would be beneficial with patient having difficulty with oral supplementation.  We will try to refer patient and see how she does.  Follow-up with me again in 4 weeks.  Patient does have what appears to be myofascial injury.  Patient likely does have significant discomfort noted.  Discussed icing regimen and home exercises.  Discussed compression, heel lift.  Discussed proper hydration.  Do feel that unfortunately patient's iron deficiency could be contributing as well.  We will send patient also for a iron infusion.  Follow-up with me again in 4 weeks  Update 04/21/2021 Melinda Nichols is a 40 y.o. female coming in with complaint of L calf pain. Patient states that she had increase in pain by mile 13. Pain over distal calf and anterior tibia. Patient took 2 weeks off before marathon. Pain did improve slightly after not working out. Patient did change running shoe to HOKA [place] X. After marathon patient struggled to walk. Constant pain sitting or weight bearing.   Patient has had 5 iron infusions since last visit. Patient felt no different after having infusions.        Past Medical History:  Diagnosis Date   GAD (generalized anxiety disorder)    Past Surgical History:  Procedure Laterality Date   ABDOMINAL SURGERY     CESAREAN SECTION  2003   CESAREAN SECTION N/A 06/10/2020   Procedure: REPEAT CESAREAN SECTION EDC: 07-22-20  PREVIOUS X 1  ALLERG: NKDA;  Surgeon: Lyn Henri, MD;  Location: [facility];  Service: Obstetrics;  Laterality: N/A;   Social History   Socioeconomic History   Marital status: Divorced    Spouse name: Not on file   Number of children: Not on file   Years of education: Not on file   Highest education level: Not on file  Occupational History   Not on file  Tobacco Use   Smoking status: Never   Smokeless tobacco: Never  Substance and Sexual Activity   Alcohol use: No   Drug use: No   Sexual activity: Never  Other Topics Concern   Not on file  Social History Narrative   Not on file   Social Determinants of Health   Financial Resource Strain: Not on file  Food Insecurity: Not on file  Transportation Needs: Not on file  Physical Activity: Not on file  Stress: Not on file  Social Connections: Not on file   No Known Allergies Family History  Problem Relation Age of Onset   Bipolar disorder Mother    Bipolar disorder Father    Cancer Sister        endometrial cancer   Bipolar disorder Brother    Heart disease Daughter    Asthma Daughter          Current Outpatient Medications (Other):    Prenatal Vit-Fe Fumarate-FA (PRENATAL MULTIVITAMIN) TABS tablet, Take 1 tablet by mouth daily at 12 noon.   Reviewed prior  external information including notes and imaging from  primary care provider As well as notes that were available from care everywhere and other healthcare systems.  Past medical history, social, surgical and family history all reviewed in electronic medical record.  No pertanent information unless stated regarding to the chief complaint.   Review of Systems:  No headache, visual changes, nausea, vomiting, diarrhea, constipation, dizziness, abdominal pain, skin rash, fevers, chills, night sweats, weight loss, swollen lymph nodes, body aches, joint swelling, chest pain, shortness of breath, mood changes. POSITIVE muscle aches  Objective  Blood pressure  115/80, pulse (!) 57, height 5\' 1"  (1.549 m), weight 106 lb (48.1 kg), SpO2 98 %, unknown if currently breastfeeding.   General: No apparent distress alert and oriented x3 mood and affect normal, dressed appropriately.  HEENT: Pupils equal, extraocular movements intact  Respiratory: Patient's speak in full sentences and does not appear short of breath  Cardiovascular: No lower extremity edema, non tender, no erythema  Gait mild antalgic Left calf exam shows the patient does have tenderness to palpation more over the gastroc area. Patient has a negative fulcrum test.  diffuse pain with palpation really overall but patient does feel more uncomfortable in the area.  Neurovascular intact distally.  Distal pulses appreciated.  Patient is has a generalized uncomfortable feeling even to light sensation in this area.  No back pain appreciated.  Full strength noted at the moment.  Deep tendon reflexes intact.  Limited muscular skeletal ultrasound was performed and interpreted by , M  Limited ultrasound of patient's calf does not show any significant bony abnormality at the moment.  Patient Does not show any significant findings either that is concerning.  Patient still has thickness noted of the fascia at the gastroc junction with the Achilles more medially.   Impression and Recommendations:     The above documentation has been reviewed and is accurate and complete Melinda Primas, DO

## 2021-04-21 ENCOUNTER — Ambulatory Visit: Payer: Self-pay

## 2021-04-21 ENCOUNTER — Ambulatory Visit: Payer: 59 | Admitting: Family Medicine

## 2021-04-21 ENCOUNTER — Ambulatory Visit (INDEPENDENT_AMBULATORY_CARE_PROVIDER_SITE_OTHER): Payer: 59

## 2021-04-21 ENCOUNTER — Encounter: Payer: Self-pay | Admitting: Family Medicine

## 2021-04-21 ENCOUNTER — Other Ambulatory Visit: Payer: Self-pay

## 2021-04-21 VITALS — BP 115/80 | HR 57 | Ht 61.0 in | Wt 106.0 lb

## 2021-04-21 DIAGNOSIS — M79662 Pain in left lower leg: Secondary | ICD-10-CM

## 2021-04-21 DIAGNOSIS — M2550 Pain in unspecified joint: Secondary | ICD-10-CM

## 2021-04-21 LAB — TSH: TSH: 2.08 u[IU]/mL (ref 0.35–5.50)

## 2021-04-21 LAB — URIC ACID: Uric Acid, Serum: 3.8 mg/dL (ref 2.4–7.0)

## 2021-04-21 LAB — VITAMIN D 25 HYDROXY (VIT D DEFICIENCY, FRACTURES): VITD: 39.88 ng/mL (ref 30.00–100.00)

## 2021-04-21 LAB — SEDIMENTATION RATE: Sed Rate: 13 mm/hr (ref 0–20)

## 2021-04-21 LAB — FERRITIN: Ferritin: 239.1 ng/mL (ref 10.0–291.0)

## 2021-04-21 NOTE — Assessment & Plan Note (Signed)
Pain in the left calf noted.  Patient is the pain seems to be out of proportion.  Patient's pain seems to be more on the medial aspect of the calf anteriorly on the tibia itself but do not know why this continues to occur.  Discussed with patient about continuing the other treatment options but at the moment this is still affecting now daily activities.  We will recheck her iron levels to see if that is still continuing with the role and noted check some other labs to make sure nothing else is contributing.  In addition of this I feel that the pain is severe enough and now affecting daily activities that advanced imaging will be warranted of the tibia and fibula area to rule out any type of an occult fracture or the possibility of some type of the cystic structure that could be also contributing to more impingement.  Patient will follow-up with me after the imaging we will discuss further.  If continuing to have difficulty I do think nerve conduction test as well as the possibility of ABI would be other avenues to consider.

## 2021-04-21 NOTE — Patient Instructions (Signed)
Labs today MRI tib/fib [facility] [phone]

## 2021-04-21 NOTE — Addendum Note (Signed)
Addended by: Ethlyn Daniels on: 04/21/2021 01:28 PM   Modules accepted: Orders

## 2021-04-23 ENCOUNTER — Ambulatory Visit (INDEPENDENT_AMBULATORY_CARE_PROVIDER_SITE_OTHER): Payer: 59

## 2021-04-23 ENCOUNTER — Other Ambulatory Visit: Payer: Self-pay

## 2021-04-23 DIAGNOSIS — G8929 Other chronic pain: Secondary | ICD-10-CM

## 2021-04-23 DIAGNOSIS — M79662 Pain in left lower leg: Secondary | ICD-10-CM | POA: Diagnosis not present

## 2021-04-23 LAB — PTH, INTACT AND CALCIUM
Calcium: 9.9 mg/dL (ref 8.6–10.2)
PTH: 31 pg/mL (ref 16–77)

## 2021-04-23 LAB — D-DIMER, QUANTITATIVE: D-Dimer, Quant: 0.31 mcg/mL FEU (ref <0.50)

## 2021-06-01 NOTE — Progress Notes (Signed)
[name] [address] [place] [address] Phone: [phone] Subjective:   Melinda Nichols, am serving as a scribe for Dr. Antoine Primas.  This visit occurred during the SARS-CoV-2 public health emergency.  Safety protocols were in place, including screening questions prior to the visit, additional usage of staff PPE, and extensive cleaning of exam room while observing appropriate contact time as indicated for disinfecting solutions.   I'm seeing this patient by the request  of:  Sharlene Dory, DO  CC: Left leg pain follow-up  [id number]  9/292022 Pain in the left calf noted.  Patient is the pain seems to be out of proportion.  Patient's pain seems to be more on the medial aspect of the calf anteriorly on the tibia itself but do not know why this continues to occur.  Discussed with patient about continuing the other treatment options but at the moment this is still affecting now daily activities.  We will recheck her iron levels to see if that is still continuing with the role and noted check some other labs to make sure nothing else is contributing.  In addition of this I feel that the pain is severe enough and now affecting daily activities that advanced imaging will be warranted of the tibia and fibula area to rule out any type of an occult fracture or the possibility of some type of the cystic structure that could be also contributing to more impingement.  Patient will follow-up with me after the imaging we will discuss further.  If continuing to have difficulty I do think nerve conduction test as well as the possibility of ABI would be other avenues to consider.  Update 06/02/2021 Mel[facility] is a 40 y.o. female coming in with complaint of L calf pain. Patient states that her leg pain has dissipated. Has not been working out.   MRI 04/23/2021 Tib/fib IMPRESSION: Mild bone marrow edema in the posterior cortex of the mid left tibial  diaphysis most concerning for stress reaction without a fracture.     Past Medical History:  Diagnosis Date   GAD (generalized anxiety disorder)    Past Surgical History:  Procedure Laterality Date   ABDOMINAL SURGERY     CESAREAN SECTION  2003   CESAREAN SECTION N/A 06/10/2020   Procedure: REPEAT CESAREAN SECTION EDC: 07-22-20 PREVIOUS X 1  ALLERG: NKDA;  Surgeon: Lyn Henri, MD;  Location: [facility];  Service: Obstetrics;  Laterality: N/A;   Social History   Socioeconomic History   Marital status: Divorced    Spouse name: Not on file   Number of children: Not on file   Years of education: Not on file   Highest education level: Not on file  Occupational History   Not on file  Tobacco Use   Smoking status: Never   Smokeless tobacco: Never  Substance and Sexual Activity   Alcohol use: No   Drug use: No   Sexual activity: Never  Other Topics Concern   Not on file  Social History Narrative   Not on file   Social Determinants of Health   Financial Resource Strain: Not on file  Food Insecurity: Not on file  Transportation Needs: Not on file  Physical Activity: Not on file  Stress: Not on file  Social Connections: Not on file   No Known Allergies Family History  Problem Relation Age of Onset   Bipolar disorder Mother    Bipolar disorder Father  Cancer Sister        endometrial cancer   Bipolar disorder Brother    Heart disease Daughter    Asthma Daughter          Current Outpatient Medications (Other):    Prenatal Vit-Fe Fumarate-FA (PRENATAL MULTIVITAMIN) TABS tablet, Take 1 tablet by mouth daily at 12 noon.   Objective  Blood pressure 102/72, pulse 65, height 5\' 1"  (1.549 m), weight 107 lb (48.5 kg), SpO2 99 %, unknown if currently breastfeeding.   General: No apparent distress alert and oriented x3 mood and affect normal, dressed appropriately.  HEENT: Pupils equal, extraocular movements intact  Respiratory: Patient's speak in full  sentences and does not appear short of breath  Cardiovascular: No lower extremity edema, non tender, no erythema  Gait normal with good balance and coordination.  MSK: Patient is nontender on exam today.  No pain over the tibial spine all.  Patient is able to jump up and down 10 times.  Negative fulcrum test.  Limited muscular skeletal ultrasound was performed and interpreted by , M  Limited ultrasound shows the patient does have what appears to be a callus formation over the area in question.  It does seem to have very mild hypoechoic changes but no increasing in neovascularization or Doppler flow.  Patient still does have some very mild thickening noted of the fascia at the calf to Achilles insertion. Impression: Interval improvement    Impression and Recommendations:     The above documentation has been reviewed and is accurate and complete Antoine Primas, DO

## 2021-06-02 ENCOUNTER — Other Ambulatory Visit: Payer: Self-pay

## 2021-06-02 ENCOUNTER — Ambulatory Visit: Payer: Self-pay

## 2021-06-02 ENCOUNTER — Ambulatory Visit: Payer: 59 | Admitting: Family Medicine

## 2021-06-02 ENCOUNTER — Encounter: Payer: Self-pay | Admitting: Family Medicine

## 2021-06-02 VITALS — BP 102/72 | HR 65 | Ht 61.0 in | Wt 107.0 lb

## 2021-06-02 DIAGNOSIS — M79662 Pain in left lower leg: Secondary | ICD-10-CM | POA: Diagnosis not present

## 2021-06-02 NOTE — Patient Instructions (Signed)
Ok to run limit to 5k 3x a week at first  Then change either frequency or duration Let's see again in 2 months and recheck iron Worsening pain please call

## 2021-06-02 NOTE — Assessment & Plan Note (Signed)
Secondary to a stress fracture.  I do believe the patient is completely healed at the moment and encouraged her to continue with the vitamin D as well as the iron supplementation.  We discussed rechecking the iron and we will wait another 2 months.  Hopefully that this is staying well.  Otherwise if any worsening symptoms we will see patient again in recheck the iron sooner.  See me again in 6 to 8 weeks

## 2021-08-02 NOTE — Progress Notes (Deleted)
[name] [address] [place] [address] Phone: [phone] Subjective:    I'm seeing this patient by the request  of:  Sharlene Dory, DO  CC:   [id number]  06/02/2021 Secondary to a stress fracture.  I do believe the patient is completely healed at the moment and encouraged her to continue with the vitamin D as well as the iron supplementation.  We discussed rechecking the iron and we will wait another 2 months.  Hopefully that this is staying well.  Otherwise if any worsening symptoms we will see patient again in recheck the iron sooner.  See me again in 6 to 8 weeks  Update 08/03/2021 Melinda [facility] is a 41 y.o. female coming in with complaint of L calf pain. Patient states        Past Medical History:  Diagnosis Date   GAD (generalized anxiety disorder)    Past Surgical History:  Procedure Laterality Date   ABDOMINAL SURGERY     CESAREAN SECTION  2003   CESAREAN SECTION N/A 06/10/2020   Procedure: REPEAT CESAREAN SECTION EDC: 07-22-20 PREVIOUS X 1  ALLERG: NKDA;  Surgeon: Lyn Henri, MD;  Location: [facility];  Service: Obstetrics;  Laterality: N/A;   Social History   Socioeconomic History   Marital status: Divorced    Spouse name: Not on file   Number of children: Not on file   Years of education: Not on file   Highest education level: Not on file  Occupational History   Not on file  Tobacco Use   Smoking status: Never   Smokeless tobacco: Never  Substance and Sexual Activity   Alcohol use: No   Drug use: No   Sexual activity: Never  Other Topics Concern   Not on file  Social History Narrative   Not on file   Social Determinants of Health   Financial Resource Strain: Not on file  Food Insecurity: Not on file  Transportation Needs: Not on file  Physical Activity: Not on file  Stress: Not on file  Social Connections: Not on file   No Known Allergies Family History  Problem Relation Age of  Onset   Bipolar disorder Mother    Bipolar disorder Father    Cancer Sister        endometrial cancer   Bipolar disorder Brother    Heart disease Daughter    Asthma Daughter          Current Outpatient Medications (Other):    Prenatal Vit-Fe Fumarate-FA (PRENATAL MULTIVITAMIN) TABS tablet, Take 1 tablet by mouth daily at 12 noon.   Reviewed prior external information including notes and imaging from  primary care provider As well as notes that were available from care everywhere and other healthcare systems.  Past medical history, social, surgical and family history all reviewed in electronic medical record.  No pertanent information unless stated regarding to the chief complaint.   Review of Systems:  No headache, visual changes, nausea, vomiting, diarrhea, constipation, dizziness, abdominal pain, skin rash, fevers, chills, night sweats, weight loss, swollen lymph nodes, body aches, joint swelling, chest pain, shortness of breath, mood changes. POSITIVE muscle aches  Objective  unknown if currently breastfeeding.   General: No apparent distress alert and oriented x3 mood and affect normal, dressed appropriately.  HEENT: Pupils equal, extraocular movements intact  Respiratory: Patient's speak in full sentences and does not appear short of breath  Cardiovascular: No lower extremity edema, non tender, no  erythema  Gait normal with good balance and coordination.  MSK:  Non tender with full range of motion and good stability and symmetric strength and tone of shoulders, elbows, wrist, hip, knee and ankles bilaterally.     Impression and Recommendations:     The above documentation has been reviewed and is accurate and complete Wilford Grist

## 2021-08-04 ENCOUNTER — Ambulatory Visit: Payer: 59 | Admitting: Family Medicine

## 2021-08-10 NOTE — Progress Notes (Signed)
[facility] [address] [place] [address] Phone: [phone] Subjective:   Melinda Nichols, am serving as a scribe for Dr. Antoine Primas. This visit occurred during the SARS-CoV-2 public health emergency.  Safety protocols were in place, including screening questions prior to the visit, additional usage of staff PPE, and extensive cleaning of exam room while observing appropriate contact time as indicated for disinfecting solutions.  I'm seeing this patient by the request  of:  Melinda Dory, DO  CC: Left calf pain  [id number]  06/02/2021 Secondary to a stress fracture.  I do believe the patient is completely healed at the moment and encouraged her to continue with the vitamin D as well as the iron supplementation.  We discussed rechecking the iron and we will wait another 2 months.  Hopefully that this is staying well.  Otherwise if any worsening symptoms we will see patient again in recheck the iron sooner.  See me again in 6 to 8 weeks  Update 08/11/2021 Melinda Nichols is a 41 y.o. female coming in with complaint of L calf pain. Patient states that she is not having     Past Medical History:  Diagnosis Date   GAD (generalized anxiety disorder)    Past Surgical History:  Procedure Laterality Date   ABDOMINAL SURGERY     CESAREAN SECTION  2003   CESAREAN SECTION N/A 06/10/2020   Procedure: REPEAT CESAREAN SECTION EDC: 07-22-20 PREVIOUS X 1  ALLERG: NKDA;  Surgeon: Lyn Henri, MD;  Location: [facility];  Service: Obstetrics;  Laterality: N/A;   Social History   Socioeconomic History   Marital status: Divorced    Spouse name: Not on file   Number of children: Not on file   Years of education: Not on file   Highest education level: Not on file  Occupational History   Not on file  Tobacco Use   Smoking status: Never   Smokeless tobacco: Never  Substance and Sexual Activity   Alcohol use: No   Drug use: No   Sexual  activity: Never  Other Topics Concern   Not on file  Social History Narrative   Not on file   Social Determinants of Health   Financial Resource Strain: Not on file  Food Insecurity: Not on file  Transportation Needs: Not on file  Physical Activity: Not on file  Stress: Not on file  Social Connections: Not on file   No Known Allergies Family History  Problem Relation Age of Onset   Bipolar disorder Mother    Bipolar disorder Father    Cancer Sister        endometrial cancer   Bipolar disorder Brother    Heart disease Daughter    Asthma Daughter          Current Outpatient Medications (Other):    Prenatal Vit-Fe Fumarate-FA (PRENATAL MULTIVITAMIN) TABS tablet, Take 1 tablet by mouth daily at 12 noon.   Reviewed prior external information including notes and imaging from  primary care provider As well as notes that were available from care everywhere and other healthcare systems.  Past medical history, social, surgical and family history all reviewed in electronic medical record.  No pertanent information unless stated regarding to the chief complaint.   Review of Systems:  No headache, visual changes, nausea, vomiting, diarrhea, constipation, dizziness, abdominal pain, skin rash, fevers, chills, night sweats, weight loss, swollen lymph nodes, body aches, joint swelling, chest pain, shortness of  breath, mood changes. POSITIVE muscle aches  Objective  Blood pressure 92/68, pulse (!) 57, height 5\' 1"  (1.549 m), weight 105 lb (47.6 kg), SpO2 98 %, unknown if currently breastfeeding.   General: No apparent distress alert and oriented x3 mood and affect normal, dressed appropriately.  HEENT: Pupils equal, extraocular movements intact  Respiratory: Patient's speak in full sentences and does not appear short of breath  Cardiovascular: No lower extremity edema, non tender, no erythema  Gait normal with good balance and coordination.  MSK: Patient's left leg does not have  any significant pain at the moment.  Patient is in jump on a 10 times with no significant pain.  Negative fulcrum test.  Nontender on palpation.  Limited muscular skeletal ultrasound was performed and interpreted by , M  Limited ultrasound does not show any significant bony abnormality at this moment.  Patient has no tenderness to palpation with compression with the ultrasound. Impression: Negative    Impression and Recommendations:    The above documentation has been reviewed and is accurate and complete Antoine Primas, DO

## 2021-08-11 ENCOUNTER — Encounter: Payer: Self-pay | Admitting: Family Medicine

## 2021-08-11 ENCOUNTER — Ambulatory Visit: Payer: Self-pay

## 2021-08-11 ENCOUNTER — Ambulatory Visit: Payer: 59 | Admitting: Family Medicine

## 2021-08-11 ENCOUNTER — Other Ambulatory Visit: Payer: Self-pay

## 2021-08-11 VITALS — BP 92/68 | HR 57 | Ht 61.0 in | Wt 105.0 lb

## 2021-08-11 DIAGNOSIS — M79662 Pain in left lower leg: Secondary | ICD-10-CM

## 2021-08-11 DIAGNOSIS — M2550 Pain in unspecified joint: Secondary | ICD-10-CM

## 2021-08-11 LAB — IBC PANEL
Iron: 87 ug/dL (ref 42–145)
Saturation Ratios: 25.4 % (ref 20.0–50.0)
TIBC: 343 ug/dL (ref 250.0–450.0)
Transferrin: 245 mg/dL (ref 212.0–360.0)

## 2021-08-11 LAB — FERRITIN: Ferritin: 341.2 ng/mL — ABNORMAL HIGH (ref 10.0–291.0)

## 2021-08-11 NOTE — Patient Instructions (Addendum)
Labs today Will write you with any changes See me when you need me

## 2021-08-11 NOTE — Assessment & Plan Note (Signed)
Patient is doing significantly better at this time.  Discussed icing regimen and home exercises.  I do believe that patient did have the injury secondary to more of the iron deficiency as well as patient increasing her activity.  We will recheck patient's iron level to make sure everything is normal.  Follow-up with me again as needed if doing well

## 2021-08-31 ENCOUNTER — Telehealth: Payer: Self-pay | Admitting: Family Medicine

## 2021-08-31 NOTE — Telephone Encounter (Signed)
Patient stated that her husband see's a provider within  heartcare on [address] for cardiology and would like to know if he referral can be transferred. Patient has spoke with facility and was advised that it will be okay

## 2021-09-02 LAB — HM PAP SMEAR

## 2021-09-21 ENCOUNTER — Other Ambulatory Visit: Payer: Self-pay | Admitting: Family Medicine

## 2021-09-21 ENCOUNTER — Encounter: Payer: Self-pay | Admitting: Family Medicine

## 2021-09-21 DIAGNOSIS — R002 Palpitations: Secondary | ICD-10-CM

## 2021-09-23 NOTE — Progress Notes (Signed)
?Cardiology Office Note:   ? ?Date:  09/26/2021  ? ?ID:  Melinda Nichols, DOB Jul 02, 1981, MRN [id number] ? ?PCP:  Sharlene Dory, DO  ? ?[facility] ?Cardiologist:  Alverda Skeans, MD ?Referring MD: Sharlene Dory*  ? ?Chief Complaint/Reason for Referral:  Palpitations ? ?ASSESSMENT:   ? ?Palpitations - Plan: EKG 12-Lead ? ?PLAN:   ? ?In order of problems listed above: ? ? TSH was normal a few months ago.  Will check monitor and echocardiogram.  Will keep follow up open-ended depending on the results of these tests. ? ? ?     ? ?  ? ?Dispo:  Return if symptoms worsen or fail to improve.  ?  ? ?Medication Adjustments/Labs and Tests Ordered: ?Current medicines are reviewed at length with the patient today.  Concerns regarding medicines are outlined above.  ? ?Tests Ordered: ?Orders Placed This Encounter  ?Procedures  ? EKG 12-Lead  ? ? ?Medication Changes: ?No orders of the defined types were placed in this encounter. ? ? ?History of Present Illness:   ? ?The patient is a 41 y.o. female with the indicated medical history here for palpitations.  She tells me she has had palpitations that have increased in frequency over the last year.  They typically happen at rest.  If she has palpitations and goes on a run the palpitations will not get better and may in fact get worse.  She never has palpitations that start when she is running.  She is unable to tell me if there are any dietary associations.  She does not smoke.  She tells me her work and life are not stressful at all.  She is trying to get pregnant and started Lupron injections recently.  Of note she ran a marathon on Saturday without issues. ? ?The palpitations can last seconds or several hours.  Occasionally she has had some lightheadedness with it but no frank syncope.  She denies any peripheral edema or paroxysmal nocturnal dyspnea.  She denies any severe bleeding episodes. ? ?    ?  ?Previous Medical History: ?Past Medical History:   ?Diagnosis Date  ? GAD (generalized anxiety disorder)   ? ? ? ?Current Medications: ?Current Meds  ?Medication Sig  ? Prenatal Vit-Fe Fumarate-FA (PRENATAL MULTIVITAMIN) TABS tablet Take 1 tablet by mouth daily at 12 noon.  ?  ? ?Allergies:    ?Patient has no known allergies.  ? ?Social History:   ?Social History  ? ?Tobacco Use  ? Smoking status: Never  ? Smokeless tobacco: Never  ?Substance Use Topics  ? Alcohol use: No  ? Drug use: No  ?  ? ?Family Hx: ?Family History  ?Problem Relation Age of Onset  ? Bipolar disorder Mother   ? Bipolar disorder Father   ? Cancer Sister   ?     endometrial cancer  ? Bipolar disorder Brother   ? Heart disease Daughter   ? Asthma Daughter   ?  ? ?Review of Systems:   ?Please see the history of present illness.    ?All other systems reviewed and are negative. ?  ? ? ?EKGs/Labs/Other Test Reviewed:   ? ?EKG:  Prior EKG SR with anterior TWI; EKG today demonstrates sinus rhythm with T wave inversions in V1 and V2 ? ?Prior CV studies: ?None available ? ?Imaging studies that I have independently reviewed today: None relevant ? ?Recent Labs: ?12/29/2020: ALT 20; BUN 13; Creatinine, Ser 0.75; Hemoglobin 8.5 Repeated and verified X2.; Magnesium 2.0;  Platelets 353.0; Potassium 4.4; Sodium 138 ?04/21/2021: TSH 2.08  ? ?Recent Lipid Panel ?No results found for: CHOL, TRIG, HDL, LDLCALC, LDLDIRECT ? ?Risk Assessment/Calculations:   ? ? ?    ? ?Physical Exam:   ? ?VS:  BP 114/70   Pulse 62   Ht 5\' 1"  (1.549 m)   Wt 115 lb (52.2 kg)   SpO2 99%   BMI 21.73 kg/m?    ?Wt Readings from Last 3 Encounters:  ?09/26/21 115 lb (52.2 kg)  ?08/11/21 105 lb (47.6 kg)  ?06/02/21 107 lb (48.5 kg)  ?  ?GENERAL:  No apparent distress, AOx3 ?HEENT:  No carotid bruits, +2 carotid impulses, no scleral icterus ?CAR: RRR no murmurs, gallops, rubs, or thrills ?RES:  Clear to auscultation bilaterally ?ABD:  Soft, nontender, nondistended, positive bowel sounds x 4 ?VASC:  +2 radial pulses, +2 carotid pulses,  palpable pedal pulses ?NEURO:  CN 2-12 grossly intact; motor and sensory grossly intact ?PSYCH:  No active depression or anxiety ?EXT:  No edema, ecchymosis, or cyanosis ? ?Signed, ?13/10/22, MD  ?09/26/2021 9:57 AM    ?[facility] ?[address] [place], [place], [place]  [place] ?Phone: [phone]; Fax: [phone]  ? ?Note:  This document was prepared using Dragon voice recognition software and may include unintentional dictation errors. ?

## 2021-09-26 ENCOUNTER — Ambulatory Visit (INDEPENDENT_AMBULATORY_CARE_PROVIDER_SITE_OTHER): Payer: 59

## 2021-09-26 ENCOUNTER — Encounter: Payer: Self-pay | Admitting: Internal Medicine

## 2021-09-26 ENCOUNTER — Other Ambulatory Visit: Payer: Self-pay

## 2021-09-26 ENCOUNTER — Ambulatory Visit: Payer: 59 | Admitting: Internal Medicine

## 2021-09-26 VITALS — BP 114/70 | HR 62 | Ht 61.0 in | Wt 115.0 lb

## 2021-09-26 DIAGNOSIS — R002 Palpitations: Secondary | ICD-10-CM | POA: Diagnosis not present

## 2021-09-26 NOTE — Patient Instructions (Signed)
Medication Instructions:  ?No changes ?*If you need a refill on your cardiac medications before your next appointment, please call your pharmacy* ? ? ?Lab Work: ?none ?If you have labs (blood work) drawn today and your tests are completely normal, you will receive your results only by: ?MyChart Message (if you have MyChart) OR ?A paper copy in the mail ?If you have any lab test that is abnormal or we need to change your treatment, we will call you to review the results. ? ? ?Testing/Procedures: ?Your physician has requested that you have an echocardiogram. Echocardiography is a painless test that uses sound waves to create images of your heart. It provides your doctor with information about the size and shape of your heart and how well your heart?s chambers and valves are working. This procedure takes approximately one hour. There are no restrictions for this procedure. ? ?Zio Heart Monitor (Patch) - to be applied today. ? ? ?Follow-Up: ?As needed ? ? ?Other Instructions ?  ?

## 2021-09-26 NOTE — Progress Notes (Unsigned)
Applied a 7 day Zio XT monitor to patient in the office 

## 2021-10-06 ENCOUNTER — Ambulatory Visit (HOSPITAL_COMMUNITY): Payer: 59 | Attending: Cardiology

## 2021-10-06 ENCOUNTER — Other Ambulatory Visit: Payer: Self-pay

## 2021-10-06 DIAGNOSIS — R002 Palpitations: Secondary | ICD-10-CM | POA: Insufficient documentation

## 2021-10-06 LAB — ECHOCARDIOGRAM COMPLETE
Area-P 1/2: 5.02 cm2
S' Lateral: 3.2 cm

## 2022-09-08 ENCOUNTER — Ambulatory Visit: Payer: 59 | Admitting: Family Medicine

## 2022-09-08 ENCOUNTER — Encounter: Payer: Self-pay | Admitting: Family Medicine

## 2022-09-08 VITALS — BP 110/72 | HR 83 | Temp 97.9°F | Ht 61.0 in | Wt 125.0 lb

## 2022-09-08 DIAGNOSIS — R09A2 Foreign body sensation, throat: Secondary | ICD-10-CM | POA: Diagnosis not present

## 2022-09-08 MED ORDER — FLUTICASONE PROPIONATE 50 MCG/ACT NA SUSP: 2.0000 | Freq: Every day | NASAL | 6 refills | Status: DC

## 2022-09-08 NOTE — Progress Notes (Signed)
Chief Complaint  Patient presents with   feels like something is stuck in her throat for 3 months    Just found out she is expecting twins    Subjective: Patient is a 43 y.o. female here for feeling something in stuck in her throat.  Has been going on for 3 mo, following a URI. Tried Pepcid and Tums without relief.  She has not tried any other medicine.  She has no difficulty swallowing or any pain associated with this.  She tried looking in her mouth and did not see anything.  She feels a lump in her throat when she swallows.  That sensation can sometimes make her nauseous.  Overall no vomiting, unintentional weight loss, or coughing.  She is pregnant with twins, around 10 weeks and.   Past Medical History:  Diagnosis Date   GAD (generalized anxiety disorder)     Objective: BP 110/72 (BP Location: Left Arm, Patient Position: Sitting, Cuff Size: Normal)   Pulse 83   Temp 97.9 F (36.6 C) (Oral)   Ht 5' 1"$  (1.549 m)   Wt 125 lb (56.7 kg)   SpO2 99%   BMI 23.62 kg/m  General: Awake, appears stated age Heart: RRR, no LE edema Lungs: CTAB, no rales, wheezes or rhonchi. No accessory muscle use Mouth: MMM, no pharyngeal exudate or erythema Neck: No asymmetry or masses appreciated. Psych: Age appropriate judgment and insight, normal affect and mood  Assessment and Plan: Globus sensation - Plan: fluticasone (FLONASE) 50 MCG/ACT nasal spray, Ambulatory referral to ENT  She will start Claritin 10 mg daily.  Flonase sent in also.  I told her that this is pregnancy category C when there are limited studies but this is likely safe.  Will refer to ENT for possible direct visualization, she will keep appointment if the above therapy does not help. The patient voiced understanding and agreement to the plan.  [place], DO 09/08/22  12:04 PM

## 2022-09-08 NOTE — Patient Instructions (Signed)
If you do not hear anything about your referral in the next 1-2 weeks, call our office and ask for an update.  Claritin (loratadine) 10 mg daily.   Flonase (fluticasone); nasal spray that is over the counter. 2 sprays each nostril, once daily. Aim towards the same side eye when you spray.  There are available OTC, and the generic versions, which may be cheaper, are in parentheses. Show this to a pharmacist if you have trouble finding any of these items.  Let us know if you need anything.

## 2022-09-11 LAB — HEPATITIS C ANTIBODY: HCV Ab: NEGATIVE

## 2022-09-11 LAB — OB RESULTS CONSOLE ABO/RH: RH Type: POSITIVE

## 2022-09-11 LAB — OB RESULTS CONSOLE HIV ANTIBODY (ROUTINE TESTING): HIV: NONREACTIVE

## 2022-09-11 LAB — OB RESULTS CONSOLE RUBELLA ANTIBODY, IGM: Rubella: IMMUNE

## 2022-09-11 LAB — OB RESULTS CONSOLE RPR: RPR: NONREACTIVE

## 2022-09-11 LAB — OB RESULTS CONSOLE ANTIBODY SCREEN: Antibody Screen: NEGATIVE

## 2022-09-11 LAB — OB RESULTS CONSOLE HEPATITIS B SURFACE ANTIGEN: Hepatitis B Surface Ag: NEGATIVE

## 2022-09-18 LAB — OB RESULTS CONSOLE GC/CHLAMYDIA
Chlamydia: NEGATIVE
Neisseria Gonorrhea: NEGATIVE

## 2022-10-25 IMAGING — MR MR [PERSON_NAME] LOW W/O CM*L*
5 of 10 series · 22 of 40 positions shown · non-contrast
Comparison: None.

CLINICAL DATA: Left calf pain, ongoing for 2 months

EXAM:
MRI OF LOWER LEFT EXTREMITY WITHOUT CONTRAST
TECHNIQUE: Multiplanar, multisequence MR imaging of the left lower leg was
performed. No intravenous contrast was administered.

[Series 6: T1 · coronal · 4.0mm · 0.59mm/px · 5 of 24 slices shown (1 of 3)]
[im 1/24]
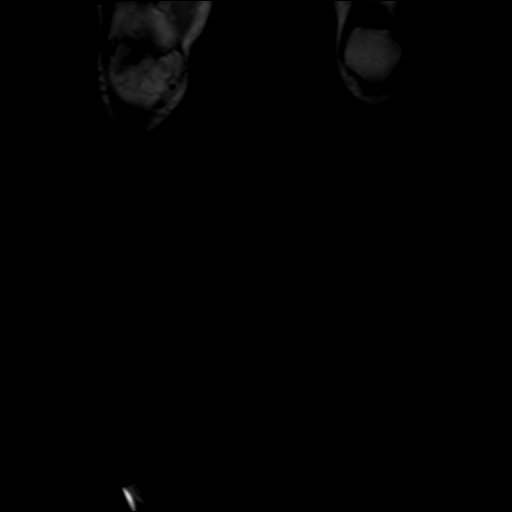
[im 6/24]
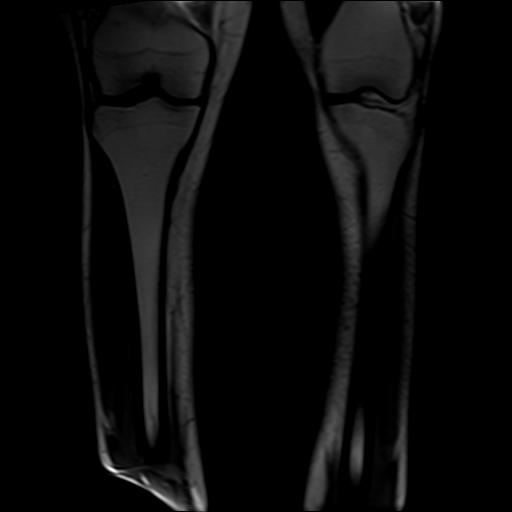
[im 12/24]
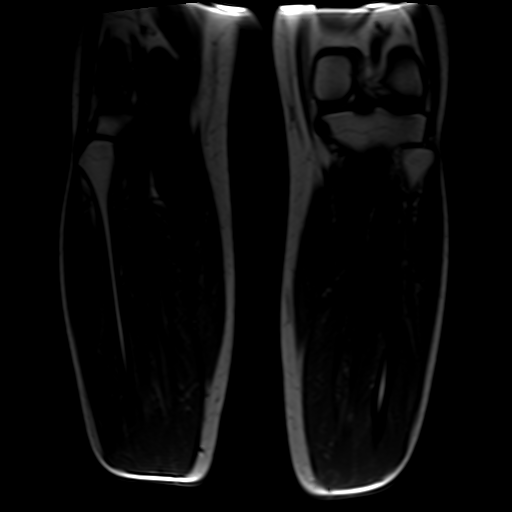
[im 18/24]
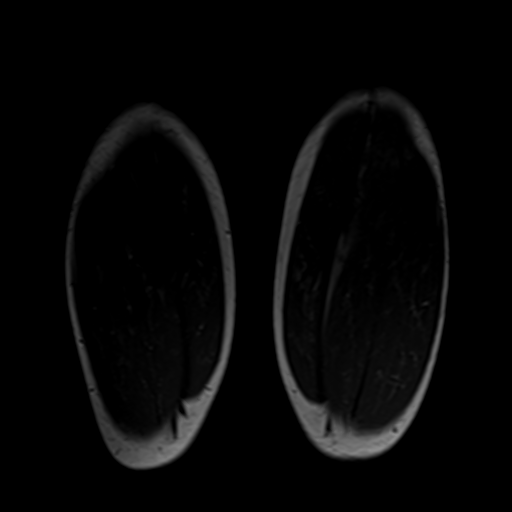
[im 24/24]
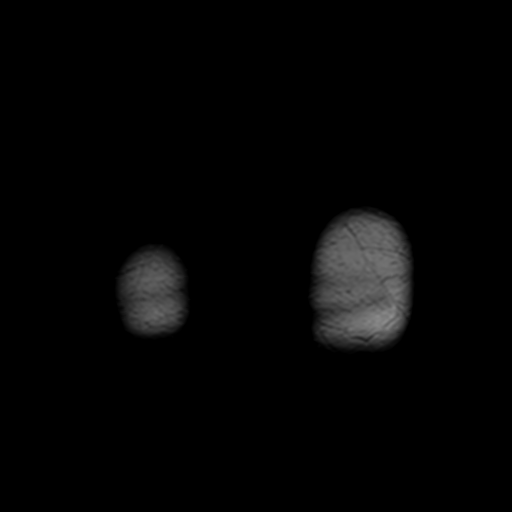

[Series 7: T1 · coronal · 4.0mm · 0.59mm/px · 3 of 22 slices shown (2 of 3)]
[im 1/22]
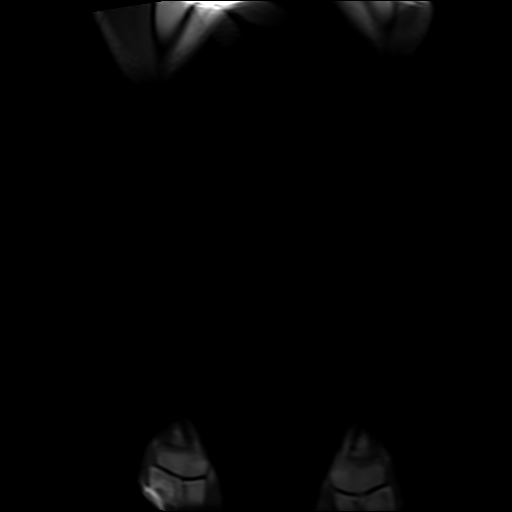
[im 11/22]
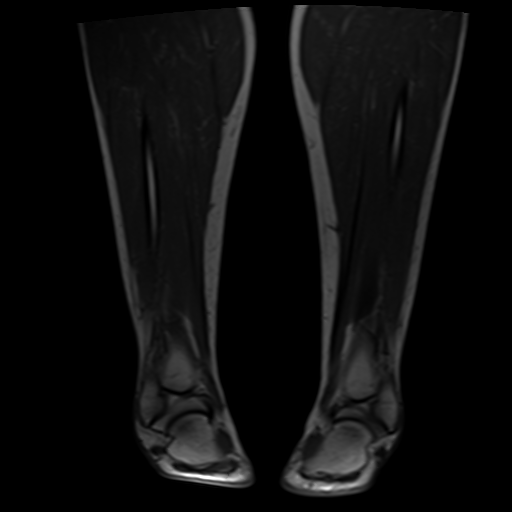
[im 22/22]
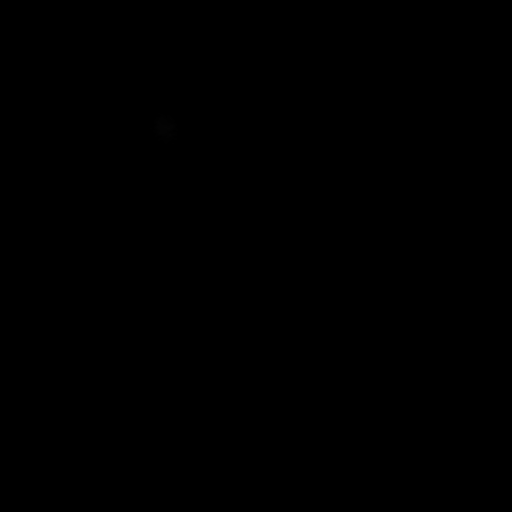

[Series 12: T1 · axial · 5.0mm · 0.31mm/px · z∈[-160,-24]mm · 4 of 40 slices shown (3 of 3)]
[im 1/40]
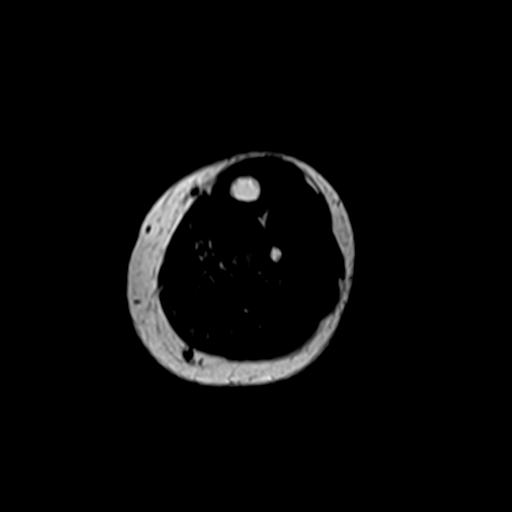
[im 8/40]
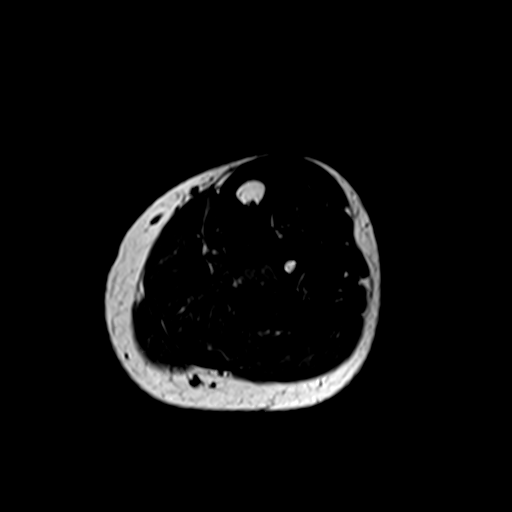
[im 16/40]
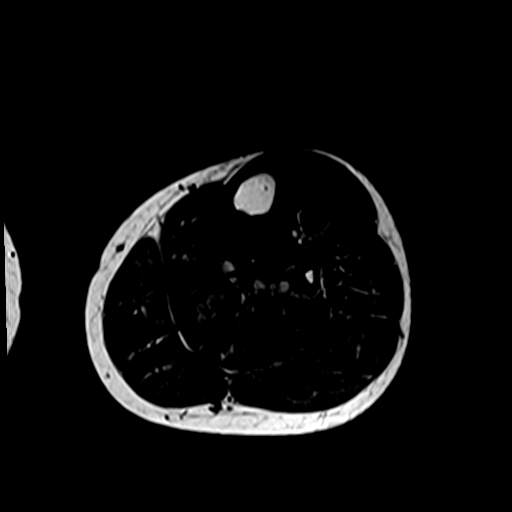
[im 24/40]
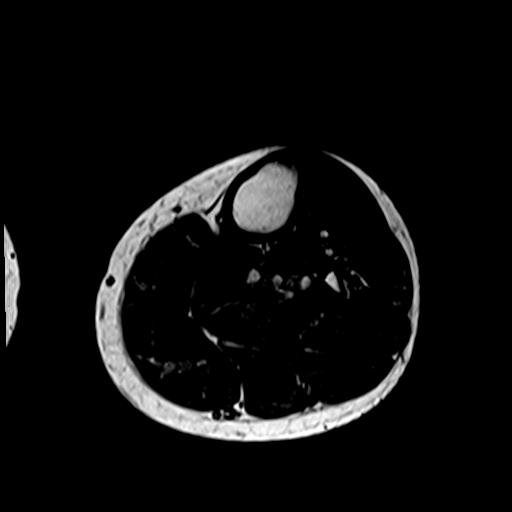

[Series 15: T2 fat-sat · axial · 5.0mm · 0.31mm/px · z∈[-160,+71]mm · 6 of 40 slices shown (1 of 2)]
[im 1/40]
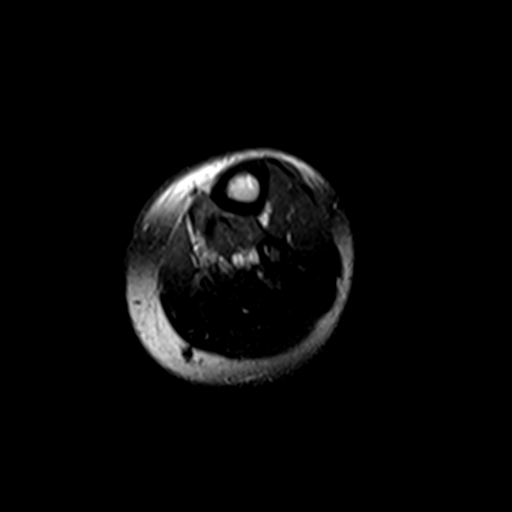
[im 8/40]
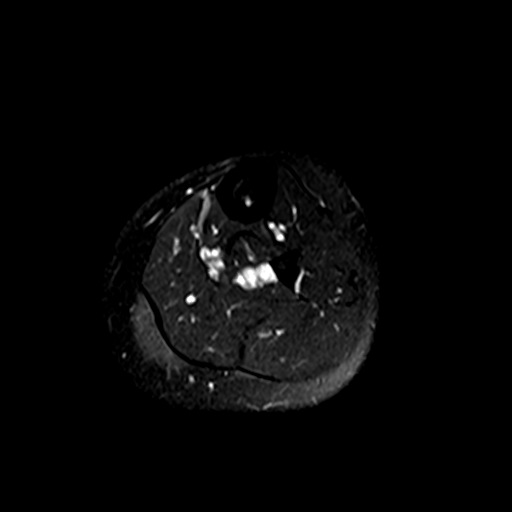
[im 16/40]
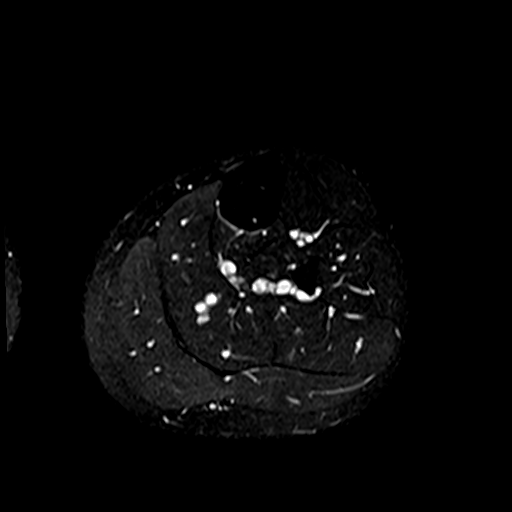
[im 24/40]
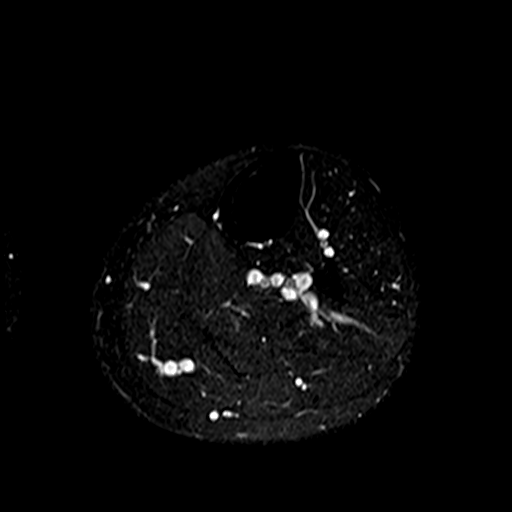
[im 32/40]
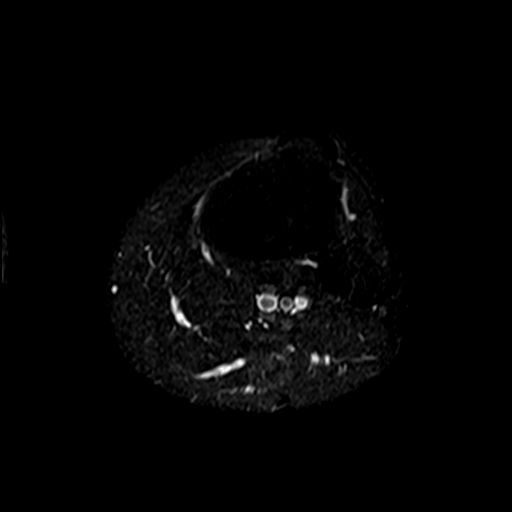
[im 40/40]
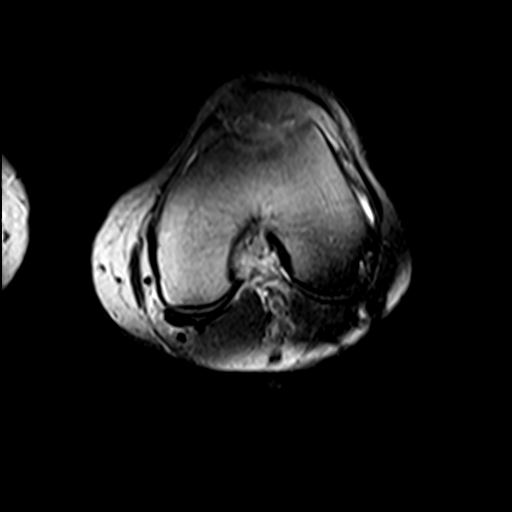

[Series 16: T2 fat-sat · axial · 5.0mm · 0.62mm/px · z∈[-282,-144]mm · 4 of 24 slices shown (2 of 2)]
[im 1/24]
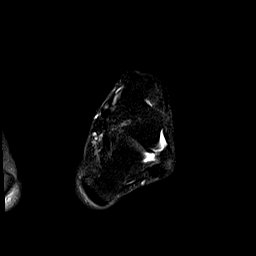
[im 8/24]
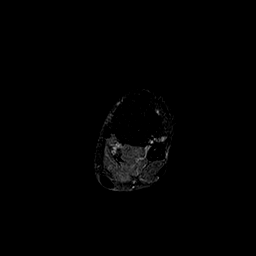
[im 16/24]
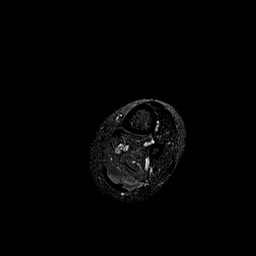
[im 24/24]
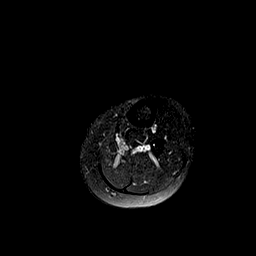

[22 of 40 positions shown; findings below may reference images not displayed]

FINDINGS: Bones/Joint/Cartilage

No fracture or dislocation. Normal alignment. No joint effusion.

Mild bone marrow edema in the posterior cortex of the mid left
tibial diaphysis most concerning for stress reaction without a
fracture.

Ligaments

Collateral ligaments are intact.

Muscles and Tendons

Muscles are normal.

Soft tissue
No fluid collection or hematoma.  No soft tissue mass.
IMPRESSION: Mild bone marrow edema in the posterior cortex of the mid left
tibial diaphysis most concerning for stress reaction without a
fracture.

## 2023-01-22 ENCOUNTER — Inpatient Hospital Stay: Payer: 59

## 2023-01-22 ENCOUNTER — Inpatient Hospital Stay: Payer: 59 | Attending: Hematology and Oncology | Admitting: Hematology and Oncology

## 2023-01-22 VITALS — BP 113/67 | HR 84 | Temp 97.7°F | Resp 15 | Wt 134.7 lb

## 2023-01-22 DIAGNOSIS — D500 Iron deficiency anemia secondary to blood loss (chronic): Secondary | ICD-10-CM

## 2023-01-22 DIAGNOSIS — D509 Iron deficiency anemia, unspecified: Secondary | ICD-10-CM | POA: Diagnosis present

## 2023-01-22 DIAGNOSIS — Z3A00 Weeks of gestation of pregnancy not specified: Secondary | ICD-10-CM | POA: Insufficient documentation

## 2023-01-22 DIAGNOSIS — O99019 Anemia complicating pregnancy, unspecified trimester: Secondary | ICD-10-CM | POA: Insufficient documentation

## 2023-01-22 LAB — RETIC PANEL
Immature Retic Fract: 40.8 % — ABNORMAL HIGH (ref 2.3–15.9)
RBC.: 3.26 MIL/uL — ABNORMAL LOW (ref 3.87–5.11)
Retic Count, Absolute: 60 10*3/uL (ref 19.0–186.0)
Retic Ct Pct: 1.8 % (ref 0.4–3.1)
Reticulocyte Hemoglobin: 27.8 pg — ABNORMAL LOW (ref >27.9)

## 2023-01-22 LAB — CMP (CANCER CENTER ONLY)
ALT: 22 U/L (ref 0–44)
AST: 35 U/L (ref 15–41)
Albumin: 3.1 g/dL — ABNORMAL LOW (ref 3.5–5.0)
Alkaline Phosphatase: 113 U/L (ref 38–126)
Anion gap: 6 (ref 5–15)
BUN: 7 mg/dL (ref 6–20)
CO2: 25 mmol/L (ref 22–32)
Calcium: 8.6 mg/dL — ABNORMAL LOW (ref 8.9–10.3)
Chloride: 105 mmol/L (ref 98–111)
Creatinine: 0.45 mg/dL (ref 0.44–1.00)
GFR, Estimated: >60 mL/min (ref >60)
Glucose, Bld: 101 mg/dL — ABNORMAL HIGH (ref 70–99)
Potassium: 4.1 mmol/L (ref 3.5–5.1)
Sodium: 136 mmol/L (ref 135–145)
Total Bilirubin: 0.3 mg/dL (ref 0.3–1.2)
Total Protein: 6.6 g/dL (ref 6.5–8.1)

## 2023-01-22 LAB — CBC WITH DIFFERENTIAL (CANCER CENTER ONLY)
Abs Immature Granulocytes: 0.22 10*3/uL — ABNORMAL HIGH (ref 0.00–0.07)
Basophils Absolute: 0 10*3/uL (ref 0.0–0.1)
Basophils Relative: 0 %
Eosinophils Absolute: 0.1 10*3/uL (ref 0.0–0.5)
Eosinophils Relative: 1 %
HCT: 29 % — ABNORMAL LOW (ref 36.0–46.0)
Hemoglobin: 9.6 g/dL — ABNORMAL LOW (ref 12.0–15.0)
Immature Granulocytes: 2 %
Lymphocytes Relative: 13 %
Lymphs Abs: 1.4 10*3/uL (ref 0.7–4.0)
MCH: 29.3 pg (ref 26.0–34.0)
MCHC: 33.1 g/dL (ref 30.0–36.0)
MCV: 88.4 fL (ref 80.0–100.0)
Monocytes Absolute: 0.8 10*3/uL (ref 0.1–1.0)
Monocytes Relative: 7 %
Neutro Abs: 8.6 10*3/uL — ABNORMAL HIGH (ref 1.7–7.7)
Neutrophils Relative %: 77 %
Platelet Count: 349 10*3/uL (ref 150–400)
RBC: 3.28 MIL/uL — ABNORMAL LOW (ref 3.87–5.11)
RDW: 12.8 % (ref 11.5–15.5)
WBC Count: 11.1 10*3/uL — ABNORMAL HIGH (ref 4.0–10.5)
nRBC: 0.2 % (ref 0.0–0.2)

## 2023-01-22 LAB — IRON AND IRON BINDING CAPACITY (CC-WL,HP ONLY)
Iron: 19 ug/dL — ABNORMAL LOW (ref 28–170)
Saturation Ratios: 2 % — ABNORMAL LOW (ref 10.4–31.8)
TIBC: 785 ug/dL — ABNORMAL HIGH (ref 250–450)
UIBC: 766 ug/dL — ABNORMAL HIGH (ref 148–442)

## 2023-01-22 LAB — FOLATE: Folate: 15.2 ng/mL (ref >5.9)

## 2023-01-22 LAB — VITAMIN B12: Vitamin B-12: 294 pg/mL (ref 180–914)

## 2023-01-22 LAB — FERRITIN: Ferritin: 7 ng/mL — ABNORMAL LOW (ref 11–307)

## 2023-01-22 NOTE — Progress Notes (Signed)
[facility] [phone]   Fax:([phone]) [phone]  INITIAL CONSULT NOTE  Patient Care Team: Sharlene Dory, DO as PCP - General (Family Medicine)  Hematological/Oncological History # Iron Deficiency Anemia in Pregnancy 01/04/2023: WBC 11.2, Hgb 9.8, MCV 89, Plt 263 01/22/2023: establish care with Dr. Leonides Schanz   CHIEF COMPLAINTS/PURPOSE OF CONSULTATION:  "Iron Deficiency Anemia in Pregnancy "  HISTORY OF PRESENTING ILLNESS:  Melinda Nichols 42 y.o. female with medical history significant for iron deficiency in pregnancy presents for evaluation of persistent iron deficiency while pregnant with twins.  On review of the previous records Melinda Nichols had labs drawn on 01/04/2023 which showed white blood cell count 11.2, hemoglobin 9.8, MCV 89, and platelets of 263.  Due to concern for these findings the patient was referred to hematology for further evaluation and management.  On exam today Melinda Nichols reports that she is currently pregnant with 2 baby girls and is due on April 08, 2023.  She is currently scheduled for C-section on March 25, 2023, at 38 weeks.  She reports that she has had issues with iron deficiency before in the past.  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  After the delivery she required 2 units of packed red blood cells.  She also was on over-the-counter iron pills for 1 year after the birth.  She had some blood work done with sports medicine shortly thereafter was found to have a hemoglobin of 5.0.  She underwent 7 rounds of IV iron infusions in 2023.  She notes that she has stayed on the iron pills continuously and is currently on them now.  They are causing some constipation and stomach upset.  Prior to becoming pregnant she reports that her menstrual cycles lasted for about 3 days with the heaviest day going to about 5-6 soaked tampons.  She knows she does eat red meat approximately twice  per week.  On further discussion she notes that she is not having bleeding anywhere else.  She denies any nosebleeds, gum bleeding, or dark stools.  She reports that her daughter is also having issues with iron deficiency and has nosebleeds and heavy menstrual bleeding.  Her mother and father are healthy.  She reports that she is a never smoker but would drink about once a week prior to becoming pregnant.  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  She does endorse having some issues with lightheadedness, dizziness and shortness of breath but no ice cravings.  She denies any fevers, chills, sweats, nausea, vomiting or diarrhea.  A full 10 point ROS is otherwise negative.  MEDICAL HISTORY:  Past Medical History:  Diagnosis Date   GAD (generalized anxiety disorder)     SURGICAL HISTORY: Past Surgical History:  Procedure Laterality Date   ABDOMINAL SURGERY     CESAREAN SECTION  2003   CESAREAN SECTION N/A 06/10/2020   Procedure: REPEAT CESAREAN SECTION EDC: 07-22-20 PREVIOUS X 1  ALLERG: NKDA;  Surgeon: Lyn Henri, MD;  Location: [facility] LD [facility]S;  Service: Obstetrics;  Laterality: N/A;    SOCIAL HISTORY: Social History   Socioeconomic History   Marital status: Married    Spouse name: Not on file   Number of children: Not on file   Years of education: Not on file   Highest education level: Not on file  Occupational History   Not on file  Tobacco Use   Smoking status: Never   Smokeless tobacco: Never  Substance and Sexual Activity   Alcohol use: No   Drug use: No   Sexual activity: Never  Other Topics Concern   Not on file  Social History Narrative   Not on file   Social Determinants of Health   Financial Resource Strain: Not on file  Food Insecurity: No Food Insecurity (01/22/2023)   Hunger Vital Sign    Worried About Running Out of Food in the Last Year: Never true    Ran Out of Food in the Last Year: Never true   Transportation Needs: No Transportation Needs (01/22/2023)   PRAPARE - Administrator, Civil Service (Medical): No    Lack of Transportation (Non-Medical): No  Physical Activity: Not on file  Stress: Not on file  Social Connections: Not on file  Intimate Partner Violence: Unknown (01/22/2023)   Humiliation, Afraid, Rape, and Kick questionnaire    Fear of Current or Ex-Partner: No    Emotionally Abused: No    Physically Abused: Not on file    Sexually Abused: No    FAMILY HISTORY: Family History  Problem Relation Age of Onset   Bipolar disorder Mother    Bipolar disorder Father    Cancer Sister        endometrial cancer   Bipolar disorder Brother    Heart disease Daughter    Asthma Daughter     ALLERGIES:  has No Known Allergies.  MEDICATIONS:  Current Outpatient Medications  Medication Sig Dispense Refill   ferrous sulfate 325 (65 FE) MG tablet Take 325 mg by mouth daily with breakfast.     omeprazole (PRILOSEC OTC) 20 MG tablet Take 20 mg by mouth daily.     famotidine (PEPCID) 20 MG tablet Take 20 mg by mouth daily.     Prenatal Vit-Fe Fumarate-FA (PRENATAL MULTIVITAMIN) TABS tablet Take 1 tablet by mouth daily at 12 noon.     No current facility-administered medications for this visit.    REVIEW OF SYSTEMS:   Constitutional: ( - ) fevers, ( - )  chills , ( - ) night sweats Eyes: ( - ) blurriness of vision, ( - ) double vision, ( - ) watery eyes Ears, nose, mouth, throat, and face: ( - ) mucositis, ( - ) sore throat Respiratory: ( - ) cough, ( - ) dyspnea, ( - ) wheezes Cardiovascular: ( - ) palpitation, ( - ) chest discomfort, ( - ) lower extremity swelling Gastrointestinal:  ( - ) nausea, ( - ) heartburn, ( - ) change in bowel habits Skin: ( - ) abnormal skin rashes Lymphatics: ( - ) new lymphadenopathy, ( - ) easy bruising Neurological: ( - ) numbness, ( - ) tingling, ( - ) new weaknesses Behavioral/Psych: ( - ) mood change, ( - ) new changes  All  other systems were reviewed with the patient and are negative.  PHYSICAL EXAMINATION:  Vitals:   01/22/23 1324  BP: 113/67  Pulse: 84  Resp: 15  Temp: 97.7 F (36.5 C)  SpO2: 100%   Filed Weights   01/22/23 1324  Weight: 134 lb 11.2 oz (61.1 kg)    GENERAL: well appearing Caucasian middle-aged pregnant female, in NAD  SKIN: skin color, texture, turgor are normal, no rashes or significant lesions EYES: conjunctiva are pink and non-injected, sclera clear LUNGS: clear to auscultation and percussion with normal breathing effort HEART: regular rate & rhythm and no murmurs and  no lower extremity edema Musculoskeletal: no cyanosis of digits and no clubbing  PSYCH: alert & oriented x 3, fluent speech NEURO: no focal motor/sensory deficits  LABORATORY DATA:  I have reviewed the data as listed    Latest Ref Rng & Units 12/29/2020    4:07 PM 06/11/2020    5:36 AM 06/10/2020    4:46 PM  CBC  WBC 4.0 - 10.5 K/uL 6.5  11.0  13.6   Hemoglobin 12.0 - 15.0 g/dL 8.5 Repeated and verified X2.  8.3  8.8   Hematocrit 36.0 - 46.0 % 27.1  24.7  25.7   Platelets 150.0 - 400.0 K/uL 353.0  178  185        Latest Ref Rng & Units 04/21/2021   11:58 AM 12/29/2020    4:07 PM 06/09/2020    6:50 PM  CMP  Glucose 70 - 99 mg/dL  87  161   BUN 6 - 23 mg/dL  13  7   Creatinine 0.96 - 1.20 mg/dL  0.45  4.09   Sodium 811 - 145 mEq/L  138  132   Potassium 3.5 - 5.1 mEq/L  4.4  3.7   Chloride 96 - 112 mEq/L  103  105   CO2 19 - 32 mEq/L  28  21   Calcium 8.6 - 10.2 mg/dL 9.9  9.4  8.3   Total Protein 6.0 - 8.3 g/dL  7.0  5.7   Total Bilirubin 0.2 - 1.2 mg/dL  0.3  0.2   Alkaline Phos 39 - 117 U/L  78  93   AST 0 - 37 U/L  22  24   ALT 0 - 35 U/L  20  14      ASSESSMENT & PLAN Melinda Nichols 42 y.o. female with medical history significant for iron deficiency in pregnancy presents for evaluation of persistent iron deficiency while pregnant with twins.  After review of the labs, review of the  records, and discussion with the patient the patients findings are most consistent with iron deficiency anemia in pregnancy.  # Iron Deficiency Anemia in Setting of Pregnancy -- Findings are consistent with iron deficiency anemia secondary to pregnancy.  Patient likely had low iron stores due to menstrual bleeding prior to pregnancy. --We will confirm iron deficiency anemia by ordering iron panel and ferritin as well as reticulocytes, CBC, and CMP -- Recommend avoiding the use of p.o. iron therapy after the first trimester as it can cause constipation and is poorly absorbed. --We will plan to proceed with IV iron therapy in order to help bolster the patient's blood counts --Plan for return to clinic in 4 to 6 weeks time after last dose of IV iron   Orders Placed This Encounter  Procedures   CBC with Differential (Cancer Center Only)    Standing Status:   Future    Number of Occurrences:   1    Standing Expiration Date:   01/22/2024   CMP (Cancer Center only)    Standing Status:   Future    Number of Occurrences:   1    Standing Expiration Date:   01/22/2024   Ferritin    Standing Status:   Future    Number of Occurrences:   1    Standing Expiration Date:   01/22/2024   Iron and Iron Binding Capacity ([facility] only)    Standing Status:   Future    Number of Occurrences:   1    Standing Expiration Date:  01/22/2024   Retic Panel    Standing Status:   Future    Number of Occurrences:   1    Standing Expiration Date:   01/22/2024   Vitamin B12    Standing Status:   Future    Number of Occurrences:   1    Standing Expiration Date:   01/22/2024   Folate, Serum    Standing Status:   Future    Number of Occurrences:   1    Standing Expiration Date:   01/22/2024    All questions were answered. The patient knows to call the clinic with any problems, questions or concerns.  A total of more than 60 minutes were spent on this encounter with face-to-face time and non-face-to-face time, including  preparing to see the patient, ordering tests and/or medications, counseling the patient and coordination of care as outlined above.   Ulysees Barns, MD Department of Hematology/Oncology [facility] at [facility] Phone: [phone] Pager: [phone] Email: [email or web address]  01/22/2023 2:07 PM

## 2023-01-23 ENCOUNTER — Telehealth: Payer: Self-pay | Admitting: Hematology and Oncology

## 2023-01-24 ENCOUNTER — Telehealth: Payer: Self-pay | Admitting: Hematology and Oncology

## 2023-02-01 ENCOUNTER — Ambulatory Visit (HOSPITAL_COMMUNITY)
Admission: RE | Admit: 2023-02-01 | Discharge: 2023-02-01 | Disposition: A | Discharge status: Home / Self Care | Payer: 59 | Source: Ambulatory Visit | Attending: Hematology and Oncology | Admitting: Hematology and Oncology

## 2023-02-01 DIAGNOSIS — D500 Iron deficiency anemia secondary to blood loss (chronic): Secondary | ICD-10-CM | POA: Diagnosis not present

## 2023-02-01 MED ORDER — SODIUM CHLORIDE 0.9 % IV SOLN
510.0000 mg | INTRAVENOUS | Status: DC
  Administered 2023-02-01: 510 mg via INTRAVENOUS
  Filled 2023-02-01: qty 510

## 2023-02-02 ENCOUNTER — Inpatient Hospital Stay (HOSPITAL_BASED_OUTPATIENT_CLINIC_OR_DEPARTMENT_OTHER): Payer: 59

## 2023-02-02 ENCOUNTER — Encounter (HOSPITAL_COMMUNITY): Payer: Self-pay | Admitting: *Deleted

## 2023-02-02 ENCOUNTER — Inpatient Hospital Stay (HOSPITAL_COMMUNITY)
Admission: AD | Admit: 2023-02-02 | Discharge: 2023-02-02 | Disposition: A | Discharge status: Home / Self Care | Payer: 59 | Attending: Obstetrics and Gynecology | Admitting: Obstetrics and Gynecology

## 2023-02-02 DIAGNOSIS — O34219 Maternal care for unspecified type scar from previous cesarean delivery: Secondary | ICD-10-CM

## 2023-02-02 DIAGNOSIS — Z3A30 30 weeks gestation of pregnancy: Secondary | ICD-10-CM

## 2023-02-02 DIAGNOSIS — O321XX1 Maternal care for breech presentation, fetus 1: Secondary | ICD-10-CM | POA: Diagnosis not present

## 2023-02-02 DIAGNOSIS — Z369 Encounter for antenatal screening, unspecified: Secondary | ICD-10-CM | POA: Insufficient documentation

## 2023-02-02 DIAGNOSIS — O30003 Twin pregnancy, unspecified number of placenta and unspecified number of amniotic sacs, third trimester: Secondary | ICD-10-CM

## 2023-02-02 DIAGNOSIS — O30043 Twin pregnancy, dichorionic/diamniotic, third trimester: Secondary | ICD-10-CM

## 2023-02-02 DIAGNOSIS — O09813 Supervision of pregnancy resulting from assisted reproductive technology, third trimester: Secondary | ICD-10-CM

## 2023-02-02 DIAGNOSIS — O99343 Other mental disorders complicating pregnancy, third trimester: Secondary | ICD-10-CM | POA: Insufficient documentation

## 2023-02-02 DIAGNOSIS — Z3689 Encounter for other specified antenatal screening: Secondary | ICD-10-CM

## 2023-02-02 DIAGNOSIS — O09523 Supervision of elderly multigravida, third trimester: Secondary | ICD-10-CM | POA: Insufficient documentation

## 2023-02-02 DIAGNOSIS — O321XX2 Maternal care for breech presentation, fetus 2: Secondary | ICD-10-CM | POA: Insufficient documentation

## 2023-02-02 DIAGNOSIS — O283 Abnormal ultrasonic finding on antenatal screening of mother: Secondary | ICD-10-CM | POA: Diagnosis not present

## 2023-02-02 NOTE — MAU Provider Note (Signed)
Chief Complaint:  Decreased Fetal Movement   Event Date/Time   First Provider Initiated Contact with Patient 02/02/23 1347      HPI: Melinda Nichols is a 42 y.o. [id number] at [redacted]w[redacted]d by date of embryo transfer with di/di twins pregnancy who presents to maternity admissions sent from the office for 4/8 BPP on Baby A on testing in the office today. Pt denies any cramping or pain.     HPI  Past Medical History: Past Medical History:  Diagnosis Date   GAD (generalized anxiety disorder)     Past obstetric history: OB History  Gravida Para Term Preterm AB Living  4 2 1 1 1 2   SAB IAB Ectopic Multiple Live Births  1     0 2    # Outcome Date GA Lbr Len/2nd Weight Sex Type Anes PTL Lv  4 Current           3 Preterm 06/10/20 [redacted]w[redacted]d  2240 g M CS-LTranv Spinal  LIV  2 Term 03/12/02    F CS-LTranv   LIV  1 SAB             Past Surgical History: Past Surgical History:  Procedure Laterality Date   ABDOMINAL SURGERY     CESAREAN SECTION  2003   CESAREAN SECTION N/A 06/10/2020   Procedure: REPEAT CESAREAN SECTION EDC: 07-22-20 PREVIOUS X 1  ALLERG: NKDA;  Surgeon: Lyn Henri, MD;  Location: [facility] LD ORS;  Service: Obstetrics;  Laterality: N/A;    Family History: Family History  Problem Relation Age of Onset   Bipolar disorder Mother    Bipolar disorder Father    Cancer Sister        endometrial cancer   Bipolar disorder Brother    Heart disease Daughter    Asthma Daughter     Social History: Social History   Tobacco Use   Smoking status: Never   Smokeless tobacco: Never  Substance Use Topics   Alcohol use: No   Drug use: No    Allergies: No Known Allergies  Meds:  No medications prior to admission.    ROS:  Review of Systems  Constitutional:  Negative for chills, fatigue and fever.  Eyes:  Negative for visual disturbance.  Respiratory:  Negative for shortness of breath.   Cardiovascular:  Negative for chest pain.  Gastrointestinal:  Negative for  abdominal pain, nausea and vomiting.  Genitourinary:  Negative for difficulty urinating, dysuria, flank pain, pelvic pain, vaginal bleeding, vaginal discharge and vaginal pain.  Neurological:  Negative for dizziness and headaches.  Psychiatric/Behavioral: Negative.       I have reviewed patient's Past Medical Hx, Surgical Hx, Family Hx, Social Hx, medications and allergies.   Physical Exam  Patient Vitals for the past 24 hrs:  BP Temp Temp src Pulse Resp SpO2 Height Weight  02/02/23 1712 115/72 -- -- 83 -- -- -- --  02/02/23 1308 113/66 97.8 F (36.6 C) Oral 77 16 100 % 5\' 1"  (1.549 m) 61.4 kg   Constitutional: Well-developed, well-nourished female in no acute distress.  Cardiovascular: normal rate Respiratory: normal effort GI: Abd soft, non-tender, gravid appropriate for gestational age.  MS: Extremities nontender, no edema, normal ROM Neurologic: Alert and oriented x 4.  GU: Neg CVAT.  PELVIC EXAM: Cervix pink, visually closed, without lesion, scant white creamy discharge, vaginal walls and external genitalia normal Bimanual exam: Cervix 0/long/high, firm, anterior, neg CMT, uterus nontender, nonenlarged, adnexa without tenderness, enlargement, or mass  FHT:  Baby A: Baseline 135 , moderate variability, accelerations present, no decelerations          Baby B: Baseline 135 , moderate variability, accelerations present, no decelerations Contractions:  none   Labs: No results found for this or any previous visit (from the past 24 hour(s)).    Imaging:  No results found.  MAU Course/MDM: Orders Placed This Encounter  Procedures   [place] MFM Fetal BPP Wo Non Stress   [place] MFM FETAL BPP WO NST ADDL GESTATION   Discharge patient    No orders of the defined types were placed in this encounter.    NST reviewed and reactive x 2 BPP 8/8 x 2, done ~ 5 hours after BPP in the office  Called Dr Rana Snare to discuss and plan for discharge with f/u on Monday in the office for BPP  Pt  discharge with strict return precautions.    Assessment: 1. Dichorionic diamniotic twin pregnancy in third trimester   2. Twin gestation in third trimester, unspecified multiple gestation type   3. NST (non-stress test) reactive   4. Advanced maternal age in multigravida, third trimester     Plan: Discharge home Labor precautions and fetal kick counts  Follow-up Information     [place] Follow up.   Why: As scheduled Contact information: [address] [place] [address] [phone]         [facility] Follow up.   Specialty: Obstetrics and Gynecology Contact information: [address] [id number] [place]he[place] [address] [phone]               Allergies as of 02/02/2023   No Known Allergies      Medication List     TAKE these medications    ferrous sulfate 325 (65 FE) MG tablet Take 325 mg by mouth daily with breakfast.   hydrOXYzine 25 MG tablet Commonly known as: ATARAX Take 25 mg by mouth 3 (three) times daily as needed.   omeprazole 20 MG tablet Commonly known as: PRILOSEC OTC Take 20 mg by mouth daily.   Pepcid 20 MG tablet Generic drug: famotidine Take 20 mg by mouth daily.   prenatal multivitamin Tabs tablet Take 1 tablet by mouth daily at 12 noon.        Sharen Counter Certified Nurse-Midwife 02/02/2023 5:16 PM

## 2023-02-02 NOTE — MAU Note (Signed)
Melinda Nichols is a 42 y.o. at Unknown here in MAU reporting: just did stress test on babies, one of the babies(A) did not pass the test.  Recently dx with cholestasis of preg.  So sent over for monitoring and repeat US.  Denies pain, bleeding or LOF. Reports decrease in movement since last night. Pt unsure which side is A.  Twin on left is vtx, twin on rt is Breech  Onset of complaint: last night Pain score: none Vitals:   02/02/23 1308  BP: 113/66  Pulse: 77  Resp: 16  Temp: 97.8 F (36.6 C)  SpO2: 100%     ZOX:WRUE 130, rt 136 Lab orders placed from triage:

## 2023-02-07 ENCOUNTER — Other Ambulatory Visit: Payer: Self-pay | Admitting: Obstetrics & Gynecology

## 2023-02-07 DIAGNOSIS — O09523 Supervision of elderly multigravida, third trimester: Secondary | ICD-10-CM

## 2023-02-07 DIAGNOSIS — K710 Toxic liver disease with cholestasis: Secondary | ICD-10-CM

## 2023-02-07 DIAGNOSIS — Z363 Encounter for antenatal screening for malformations: Secondary | ICD-10-CM

## 2023-02-07 DIAGNOSIS — O30043 Twin pregnancy, dichorionic/diamniotic, third trimester: Secondary | ICD-10-CM

## 2023-02-08 ENCOUNTER — Ambulatory Visit (HOSPITAL_COMMUNITY)
Admission: RE | Admit: 2023-02-08 | Discharge: 2023-02-08 | Disposition: A | Discharge status: Home / Self Care | Payer: 59 | Source: Ambulatory Visit | Attending: Hematology and Oncology | Admitting: Hematology and Oncology

## 2023-02-08 DIAGNOSIS — D500 Iron deficiency anemia secondary to blood loss (chronic): Secondary | ICD-10-CM | POA: Insufficient documentation

## 2023-02-08 MED ORDER — SODIUM CHLORIDE 0.9 % IV SOLN
510.0000 mg | INTRAVENOUS | Status: AC
  Administered 2023-02-08: 510 mg via INTRAVENOUS
  Filled 2023-02-08: qty 510

## 2023-02-12 DIAGNOSIS — O30049 Twin pregnancy, dichorionic/diamniotic, unspecified trimester: Secondary | ICD-10-CM | POA: Insufficient documentation

## 2023-02-12 DIAGNOSIS — O34219 Maternal care for unspecified type scar from previous cesarean delivery: Secondary | ICD-10-CM | POA: Insufficient documentation

## 2023-02-12 DIAGNOSIS — O09529 Supervision of elderly multigravida, unspecified trimester: Secondary | ICD-10-CM | POA: Insufficient documentation

## 2023-02-12 DIAGNOSIS — K710 Toxic liver disease with cholestasis: Secondary | ICD-10-CM | POA: Insufficient documentation

## 2023-02-12 DIAGNOSIS — Z8759 Personal history of other complications of pregnancy, childbirth and the puerperium: Secondary | ICD-10-CM | POA: Insufficient documentation

## 2023-02-14 ENCOUNTER — Ambulatory Visit: Payer: 59 | Attending: Obstetrics & Gynecology

## 2023-02-14 ENCOUNTER — Ambulatory Visit: Payer: 59 | Admitting: *Deleted

## 2023-02-14 ENCOUNTER — Other Ambulatory Visit: Payer: Self-pay | Admitting: Obstetrics & Gynecology

## 2023-02-14 ENCOUNTER — Encounter: Payer: Self-pay | Admitting: *Deleted

## 2023-02-14 VITALS — BP 100/61 | HR 77

## 2023-02-14 DIAGNOSIS — O30043 Twin pregnancy, dichorionic/diamniotic, third trimester: Secondary | ICD-10-CM

## 2023-02-14 DIAGNOSIS — O26613 Liver and biliary tract disorders in pregnancy, third trimester: Secondary | ICD-10-CM | POA: Diagnosis not present

## 2023-02-14 DIAGNOSIS — K710 Toxic liver disease with cholestasis: Secondary | ICD-10-CM | POA: Diagnosis present

## 2023-02-14 DIAGNOSIS — O352XX2 Maternal care for (suspected) hereditary disease in fetus, fetus 2: Secondary | ICD-10-CM

## 2023-02-14 DIAGNOSIS — O09523 Supervision of elderly multigravida, third trimester: Secondary | ICD-10-CM

## 2023-02-14 DIAGNOSIS — O34219 Maternal care for unspecified type scar from previous cesarean delivery: Secondary | ICD-10-CM | POA: Insufficient documentation

## 2023-02-14 DIAGNOSIS — Z8759 Personal history of other complications of pregnancy, childbirth and the puerperium: Secondary | ICD-10-CM

## 2023-02-14 DIAGNOSIS — O352XX1 Maternal care for (suspected) hereditary disease in fetus, fetus 1: Secondary | ICD-10-CM

## 2023-02-14 DIAGNOSIS — Z363 Encounter for antenatal screening for malformations: Secondary | ICD-10-CM

## 2023-02-14 DIAGNOSIS — O09813 Supervision of pregnancy resulting from assisted reproductive technology, third trimester: Secondary | ICD-10-CM

## 2023-02-14 DIAGNOSIS — O09213 Supervision of pregnancy with history of pre-term labor, third trimester: Secondary | ICD-10-CM

## 2023-02-14 DIAGNOSIS — K831 Obstruction of bile duct: Secondary | ICD-10-CM

## 2023-02-14 DIAGNOSIS — Z3A32 32 weeks gestation of pregnancy: Secondary | ICD-10-CM

## 2023-02-14 DIAGNOSIS — O35BXX1 Maternal care for other (suspected) fetal abnormality and damage, fetal cardiac anomalies, fetus 1: Secondary | ICD-10-CM

## 2023-02-16 ENCOUNTER — Telehealth: Payer: Self-pay | Admitting: Hematology and Oncology

## 2023-02-21 ENCOUNTER — Telehealth: Payer: Self-pay | Admitting: Hematology and Oncology

## 2023-02-21 NOTE — Telephone Encounter (Signed)
Patient called wanting to cancel appointment; patient stated they will continue to follow up OB and no longer needed to continue their care with Melinda Nichols

## 2023-02-27 ENCOUNTER — Encounter (HOSPITAL_COMMUNITY): Payer: Self-pay

## 2023-02-27 ENCOUNTER — Telehealth (HOSPITAL_COMMUNITY): Payer: Self-pay | Admitting: *Deleted

## 2023-02-27 ENCOUNTER — Other Ambulatory Visit: Payer: 59

## 2023-02-27 ENCOUNTER — Ambulatory Visit: Payer: 59 | Admitting: Hematology and Oncology

## 2023-02-27 NOTE — Telephone Encounter (Signed)
Preadmission screen  

## 2023-02-27 NOTE — Patient Instructions (Signed)
Melinda Nichols  02/27/2023   Your procedure is scheduled on:  03/13/2023  Arrive at 0800 at Entrance C on [address] at [facility]  and [employer]. You are invited to use the FREE valet parking or use the Visitor's parking deck.  Pick up the phone at the desk and dial [phone].  Call this number if you have problems the morning of surgery: [phone]  Remember:   Do not eat food:(After Midnight) Desps de medianoche.  Do not drink clear liquids: (After Midnight) Desps de medianoche.  Take these medicines the morning of surgery with A SIP OF WATER:  none   Do not wear jewelry, make-up or nail polish.  Do not wear lotions, powders, or perfumes. Do not wear deodorant.  Do not shave 48 hours prior to surgery.  Do not bring valuables to the hospital.  [facility] is not   responsible for any belongings or valuables brought to the hospital.  Contacts, dentures or bridgework may not be worn into surgery.  Leave suitcase in the car. After surgery it may be brought to your room.  For patients admitted to the hospital, checkout time is 11:00 AM the day of              discharge.      Please read over the following fact sheets that you were given:     Preparing for Surgery

## 2023-02-28 ENCOUNTER — Encounter (HOSPITAL_COMMUNITY): Payer: Self-pay

## 2023-03-02 ENCOUNTER — Other Ambulatory Visit: Payer: Self-pay

## 2023-03-02 ENCOUNTER — Telehealth: Payer: Self-pay

## 2023-03-02 NOTE — Telephone Encounter (Signed)
Auth Submission: NO AUTH NEEDED Site of care: Site of care: [facility] Payer: [employer] Medication & CPT/J Code(s) submitted: Venofer (Iron Sucrose) J1756  Auth type: Buy/Bill HB Units/visits requested: 300mg  at least 7 days apart x 3 doses  Approval from: 03/02/23 to 07/02/23

## 2023-03-07 ENCOUNTER — Other Ambulatory Visit: Payer: Self-pay | Admitting: Obstetrics and Gynecology

## 2023-03-07 ENCOUNTER — Ambulatory Visit (INDEPENDENT_AMBULATORY_CARE_PROVIDER_SITE_OTHER): Payer: 59

## 2023-03-07 VITALS — BP 100/63 | HR 64 | Temp 98.1°F | Resp 20 | Ht 61.0 in | Wt 142.0 lb

## 2023-03-07 DIAGNOSIS — O365990 Maternal care for other known or suspected poor fetal growth, unspecified trimester, not applicable or unspecified: Secondary | ICD-10-CM

## 2023-03-07 DIAGNOSIS — D509 Iron deficiency anemia, unspecified: Secondary | ICD-10-CM | POA: Diagnosis not present

## 2023-03-07 MED ORDER — SODIUM CHLORIDE 0.9 % IV SOLN
300.0000 mg | Freq: Once | INTRAVENOUS | Status: AC
  Administered 2023-03-07: 300 mg via INTRAVENOUS
  Filled 2023-03-07: qty 15

## 2023-03-07 MED ORDER — ACETAMINOPHEN 325 MG PO TABS
650.0000 mg | ORAL_TABLET | Freq: Once | ORAL | Status: AC
  Administered 2023-03-07: 650 mg via ORAL
  Filled 2023-03-07: qty 2

## 2023-03-07 MED ORDER — DIPHENHYDRAMINE HCL 25 MG PO CAPS
25.0000 mg | ORAL_CAPSULE | Freq: Once | ORAL | Status: AC
  Administered 2023-03-07: 25 mg via ORAL
  Filled 2023-03-07: qty 1

## 2023-03-07 NOTE — Progress Notes (Signed)
Diagnosis: Iron Deficiency Anemia  Provider:  Chilton Greathouse MD  Procedure: IV Infusion  IV Type: Peripheral, IV Location: R Antecubital  Venofer (Iron Sucrose), Dose: 300 mg  Infusion Start Time: 1320  Infusion Stop Time: 1500  Post Infusion IV Care: Observation period completed and Peripheral IV Discontinued  Discharge: Condition: Good, Destination: Home . AVS Declined  Performed by:  Rico Ala, LPN

## 2023-03-08 ENCOUNTER — Other Ambulatory Visit: Payer: 59

## 2023-03-08 ENCOUNTER — Encounter: Payer: Self-pay | Admitting: *Deleted

## 2023-03-08 ENCOUNTER — Ambulatory Visit: Payer: 59 | Admitting: Hematology and Oncology

## 2023-03-09 ENCOUNTER — Ambulatory Visit: Payer: 59 | Attending: Obstetrics & Gynecology

## 2023-03-09 ENCOUNTER — Other Ambulatory Visit: Payer: Self-pay | Admitting: Obstetrics and Gynecology

## 2023-03-09 ENCOUNTER — Ambulatory Visit: Payer: 59 | Admitting: *Deleted

## 2023-03-09 ENCOUNTER — Other Ambulatory Visit: Payer: 59

## 2023-03-09 ENCOUNTER — Ambulatory Visit: Payer: 59 | Admitting: Hematology and Oncology

## 2023-03-09 VITALS — BP 100/58 | HR 63

## 2023-03-09 DIAGNOSIS — O35BXX1 Maternal care for other (suspected) fetal abnormality and damage, fetal cardiac anomalies, fetus 1: Secondary | ICD-10-CM

## 2023-03-09 DIAGNOSIS — O365990 Maternal care for other known or suspected poor fetal growth, unspecified trimester, not applicable or unspecified: Secondary | ICD-10-CM

## 2023-03-09 DIAGNOSIS — O30043 Twin pregnancy, dichorionic/diamniotic, third trimester: Secondary | ICD-10-CM

## 2023-03-09 DIAGNOSIS — O09213 Supervision of pregnancy with history of pre-term labor, third trimester: Secondary | ICD-10-CM

## 2023-03-09 DIAGNOSIS — K831 Obstruction of bile duct: Secondary | ICD-10-CM | POA: Diagnosis not present

## 2023-03-09 DIAGNOSIS — O365992 Maternal care for other known or suspected poor fetal growth, unspecified trimester, fetus 2: Secondary | ICD-10-CM | POA: Insufficient documentation

## 2023-03-09 DIAGNOSIS — O09293 Supervision of pregnancy with other poor reproductive or obstetric history, third trimester: Secondary | ICD-10-CM

## 2023-03-09 DIAGNOSIS — O09523 Supervision of elderly multigravida, third trimester: Secondary | ICD-10-CM | POA: Insufficient documentation

## 2023-03-09 DIAGNOSIS — O34219 Maternal care for unspecified type scar from previous cesarean delivery: Secondary | ICD-10-CM | POA: Diagnosis not present

## 2023-03-09 DIAGNOSIS — O26643 Intrahepatic cholestasis of pregnancy, third trimester: Secondary | ICD-10-CM | POA: Diagnosis not present

## 2023-03-09 DIAGNOSIS — Z3A35 35 weeks gestation of pregnancy: Secondary | ICD-10-CM

## 2023-03-09 DIAGNOSIS — O09813 Supervision of pregnancy resulting from assisted reproductive technology, third trimester: Secondary | ICD-10-CM

## 2023-03-09 NOTE — Procedures (Signed)
Maryfrances Edmond 1980/08/22 [redacted]w[redacted]d   Fetus B Non-Stress Test Interpretation for 03/09/23  Indication: IUGR, DIDI  Fetal Heart Rate Fetus B Mode: External Baseline Rate (B): 125 BPM Variability: Moderate Accelerations: 15 x 15 Decelerations: None  Uterine Activity Mode: Toco Contraction Frequency (min): UI Resting Tone Palpated: Relaxed  Interpretation (Baby B - Fetal Testing) Nonstress Test Interpretation (Baby B): Reactive Comments (Baby B): Tracing reviewed byDr. Birdie Riddle August 30, 1980 [redacted]w[redacted]d  Fetus A Non-Stress Test Interpretation for 03/09/23  Indication:    DIDI  Fetal Heart Rate A Mode: External Baseline Rate (A): 130 bpm Variability: Moderate Accelerations: 15 x 15 Decelerations: None Multiple birth?: Yes  Uterine Activity Mode: Toco Contraction Frequency (min): UI Resting Tone Palpated: Relaxed  Interpretation (Fetal Testing) Nonstress Test Interpretation: Reactive Comments: Tracing reviewed by Dr. Darra Lis

## 2023-03-12 ENCOUNTER — Encounter (HOSPITAL_COMMUNITY)
Admission: RE | Admit: 2023-03-12 | Discharge: 2023-03-12 | Disposition: A | Discharge status: Home / Self Care | Payer: 59 | Source: Ambulatory Visit | Attending: Family Medicine | Admitting: Family Medicine

## 2023-03-12 ENCOUNTER — Encounter (HOSPITAL_COMMUNITY): Payer: Self-pay | Admitting: Obstetrics and Gynecology

## 2023-03-12 VITALS — Ht 61.0 in | Wt 140.0 lb

## 2023-03-12 DIAGNOSIS — O30043 Twin pregnancy, dichorionic/diamniotic, third trimester: Secondary | ICD-10-CM | POA: Insufficient documentation

## 2023-03-12 DIAGNOSIS — Z3A36 36 weeks gestation of pregnancy: Secondary | ICD-10-CM | POA: Insufficient documentation

## 2023-03-12 DIAGNOSIS — O34219 Maternal care for unspecified type scar from previous cesarean delivery: Secondary | ICD-10-CM | POA: Insufficient documentation

## 2023-03-12 HISTORY — DX: Encounter for other specified aftercare: Z51.89

## 2023-03-12 HISTORY — DX: Intrahepatic cholestasis of pregnancy, unspecified trimester: O26.649

## 2023-03-12 LAB — CBC
HCT: 34.6 % — ABNORMAL LOW (ref 36.0–46.0)
Hemoglobin: 11.2 g/dL — ABNORMAL LOW (ref 12.0–15.0)
MCH: 30 pg (ref 26.0–34.0)
MCHC: 32.4 g/dL (ref 30.0–36.0)
MCV: 92.8 fL (ref 80.0–100.0)
Platelets: 245 10*3/uL (ref 150–400)
RBC: 3.73 MIL/uL — ABNORMAL LOW (ref 3.87–5.11)
RDW: 17.8 % — ABNORMAL HIGH (ref 11.5–15.5)
WBC: 7.8 10*3/uL (ref 4.0–10.5)
nRBC: 0 % (ref 0.0–0.2)

## 2023-03-12 LAB — RPR: RPR Ser Ql: NONREACTIVE

## 2023-03-13 ENCOUNTER — Inpatient Hospital Stay (HOSPITAL_COMMUNITY): Admission: RE | Admit: 2023-03-13 | Payer: 59 | Source: Home / Self Care | Admitting: Obstetrics and Gynecology

## 2023-03-13 ENCOUNTER — Encounter (HOSPITAL_COMMUNITY)
Admission: AD | Disposition: A | Discharge status: Home / Self Care | Payer: Self-pay | Source: Home / Self Care | Attending: Obstetrics and Gynecology

## 2023-03-13 ENCOUNTER — Encounter: Admission: RE | Payer: Self-pay | Source: Home / Self Care

## 2023-03-13 ENCOUNTER — Other Ambulatory Visit: Payer: Self-pay

## 2023-03-13 ENCOUNTER — Inpatient Hospital Stay (HOSPITAL_COMMUNITY)
Admission: AD | Admit: 2023-03-13 | Discharge: 2023-03-16 | DRG: 786 | Disposition: A | Discharge status: Home / Self Care | Payer: 59 | Attending: Obstetrics and Gynecology | Admitting: Obstetrics and Gynecology

## 2023-03-13 ENCOUNTER — Inpatient Hospital Stay (HOSPITAL_COMMUNITY): Payer: 59 | Admitting: Anesthesiology

## 2023-03-13 ENCOUNTER — Encounter (HOSPITAL_COMMUNITY): Payer: Self-pay | Admitting: Obstetrics and Gynecology

## 2023-03-13 DIAGNOSIS — O30043 Twin pregnancy, dichorionic/diamniotic, third trimester: Secondary | ICD-10-CM | POA: Diagnosis present

## 2023-03-13 DIAGNOSIS — K831 Obstruction of bile duct: Secondary | ICD-10-CM | POA: Diagnosis present

## 2023-03-13 DIAGNOSIS — O321XX1 Maternal care for breech presentation, fetus 1: Secondary | ICD-10-CM | POA: Diagnosis present

## 2023-03-13 DIAGNOSIS — Z3A36 36 weeks gestation of pregnancy: Secondary | ICD-10-CM

## 2023-03-13 DIAGNOSIS — O34211 Maternal care for low transverse scar from previous cesarean delivery: Secondary | ICD-10-CM | POA: Diagnosis present

## 2023-03-13 DIAGNOSIS — O321XX0 Maternal care for breech presentation, not applicable or unspecified: Secondary | ICD-10-CM | POA: Diagnosis not present

## 2023-03-13 DIAGNOSIS — Z98891 History of uterine scar from previous surgery: Secondary | ICD-10-CM

## 2023-03-13 DIAGNOSIS — O34219 Maternal care for unspecified type scar from previous cesarean delivery: Secondary | ICD-10-CM

## 2023-03-13 DIAGNOSIS — O365931 Maternal care for other known or suspected poor fetal growth, third trimester, fetus 1: Secondary | ICD-10-CM | POA: Diagnosis present

## 2023-03-13 DIAGNOSIS — O365932 Maternal care for other known or suspected poor fetal growth, third trimester, fetus 2: Secondary | ICD-10-CM | POA: Diagnosis present

## 2023-03-13 DIAGNOSIS — O26643 Intrahepatic cholestasis of pregnancy, third trimester: Secondary | ICD-10-CM | POA: Diagnosis present

## 2023-03-13 LAB — POCT I-STAT EG7
Acid-base deficit: 7 mmol/L — ABNORMAL HIGH (ref 0.0–2.0)
Acid-base deficit: 9 mmol/L — ABNORMAL HIGH (ref 0.0–2.0)
Bicarbonate: 18.4 mmol/L — ABNORMAL LOW (ref 20.0–28.0)
Bicarbonate: 17.8 mmol/L — ABNORMAL LOW (ref 20.0–28.0)
Calcium, Ion: 1.19 mmol/L (ref 1.15–1.40)
Calcium, Ion: 1.06 mmol/L — ABNORMAL LOW (ref 1.15–1.40)
HCT: 21 % — ABNORMAL LOW (ref 36.0–46.0)
HCT: 26 % — ABNORMAL LOW (ref 36.0–46.0)
Hemoglobin: 7.1 g/dL — ABNORMAL LOW (ref 12.0–15.0)
Hemoglobin: 8.8 g/dL — ABNORMAL LOW (ref 12.0–15.0)
O2 Saturation: 64 %
O2 Saturation: 17 %
Potassium: 3.3 mmol/L — ABNORMAL LOW (ref 3.5–5.1)
Potassium: 3.1 mmol/L — ABNORMAL LOW (ref 3.5–5.1)
Sodium: 138 mmol/L (ref 135–145)
Sodium: 140 mmol/L (ref 135–145)
TCO2: 19 mmol/L — ABNORMAL LOW (ref 22–32)
TCO2: 19 mmol/L — ABNORMAL LOW (ref 22–32)
pCO2, Ven: 35.3 mmHg — ABNORMAL LOW (ref 44–60)
pCO2, Ven: 41.2 mmHg — ABNORMAL LOW (ref 44–60)
pH, Ven: 7.326 (ref 7.25–7.43)
pH, Ven: 7.245 — ABNORMAL LOW (ref 7.25–7.43)
pO2, Ven: 36 mmHg (ref 32–45)
pO2, Ven: 16 mmHg — CL (ref 32–45)

## 2023-03-13 LAB — DIC (DISSEMINATED INTRAVASCULAR COAGULATION)PANEL
D-Dimer, Quant: 5.23 ug{FEU}/mL — ABNORMAL HIGH (ref 0.00–0.50)
D-Dimer, Quant: 2.35 ug{FEU}/mL — ABNORMAL HIGH (ref 0.00–0.50)
D-Dimer, Quant: 1.09 ug{FEU}/mL — ABNORMAL HIGH (ref 0.00–0.50)
Fibrinogen: 325 mg/dL (ref 210–475)
Fibrinogen: 288 mg/dL (ref 210–475)
Fibrinogen: 287 mg/dL (ref 210–475)
INR: 1.2 (ref 0.8–1.2)
INR: 1.2 (ref 0.8–1.2)
INR: 1.2 (ref 0.8–1.2)
Platelets: 190 10*3/uL (ref 150–400)
Platelets: 130 10*3/uL — ABNORMAL LOW (ref 150–400)
Platelets: 128 10*3/uL — ABNORMAL LOW (ref 150–400)
Prothrombin Time: 15.1 seconds (ref 11.4–15.2)
Prothrombin Time: 14.9 seconds (ref 11.4–15.2)
Prothrombin Time: 15 seconds (ref 11.4–15.2)
Smear Review: NONE SEEN
Smear Review: NONE SEEN
Smear Review: NONE SEEN
aPTT: 27 seconds (ref 24–36)
aPTT: 27 seconds (ref 24–36)
aPTT: 27 seconds (ref 24–36)

## 2023-03-13 LAB — POCT I-STAT 7, (LYTES, BLD GAS, ICA,H+H)
Acid-base deficit: 6 mmol/L — ABNORMAL HIGH (ref 0.0–2.0)
Acid-base deficit: 6 mmol/L — ABNORMAL HIGH (ref 0.0–2.0)
Bicarbonate: 19.4 mmol/L — ABNORMAL LOW (ref 20.0–28.0)
Bicarbonate: 18.4 mmol/L — ABNORMAL LOW (ref 20.0–28.0)
Calcium, Ion: 1.3 mmol/L (ref 1.15–1.40)
Calcium, Ion: 1.21 mmol/L (ref 1.15–1.40)
HCT: 21 % — ABNORMAL LOW (ref 36.0–46.0)
HCT: 30 % — ABNORMAL LOW (ref 36.0–46.0)
Hemoglobin: 7.1 g/dL — ABNORMAL LOW (ref 12.0–15.0)
Hemoglobin: 10.2 g/dL — ABNORMAL LOW (ref 12.0–15.0)
O2 Saturation: 99 %
O2 Saturation: 99 %
Potassium: 3.9 mmol/L (ref 3.5–5.1)
Potassium: 4.1 mmol/L (ref 3.5–5.1)
Sodium: 138 mmol/L (ref 135–145)
Sodium: 137 mmol/L (ref 135–145)
TCO2: 21 mmol/L — ABNORMAL LOW (ref 22–32)
TCO2: 19 mmol/L — ABNORMAL LOW (ref 22–32)
pCO2 arterial: 37.6 mmHg (ref 32–48)
pCO2 arterial: 31 mmHg — ABNORMAL LOW (ref 32–48)
pH, Arterial: 7.321 — ABNORMAL LOW (ref 7.35–7.45)
pH, Arterial: 7.38 (ref 7.35–7.45)
pO2, Arterial: 145 mmHg — ABNORMAL HIGH (ref 83–108)
pO2, Arterial: 124 mmHg — ABNORMAL HIGH (ref 83–108)

## 2023-03-13 LAB — CBC
HCT: 28.7 % — ABNORMAL LOW (ref 36.0–46.0)
HCT: 31.8 % — ABNORMAL LOW (ref 36.0–46.0)
HCT: 28.2 % — ABNORMAL LOW (ref 36.0–46.0)
Hemoglobin: 9.3 g/dL — ABNORMAL LOW (ref 12.0–15.0)
Hemoglobin: 10.7 g/dL — ABNORMAL LOW (ref 12.0–15.0)
Hemoglobin: 9.8 g/dL — ABNORMAL LOW (ref 12.0–15.0)
MCH: 30 pg (ref 26.0–34.0)
MCH: 28.9 pg (ref 26.0–34.0)
MCH: 29.2 pg (ref 26.0–34.0)
MCHC: 32.4 g/dL (ref 30.0–36.0)
MCHC: 33.6 g/dL (ref 30.0–36.0)
MCHC: 34.8 g/dL (ref 30.0–36.0)
MCV: 92.6 fL (ref 80.0–100.0)
MCV: 85.9 fL (ref 80.0–100.0)
MCV: 83.9 fL (ref 80.0–100.0)
Platelets: 178 10*3/uL (ref 150–400)
Platelets: 135 10*3/uL — ABNORMAL LOW (ref 150–400)
Platelets: 140 10*3/uL — ABNORMAL LOW (ref 150–400)
RBC: 3.1 MIL/uL — ABNORMAL LOW (ref 3.87–5.11)
RBC: 3.7 MIL/uL — ABNORMAL LOW (ref 3.87–5.11)
RBC: 3.36 MIL/uL — ABNORMAL LOW (ref 3.87–5.11)
RDW: 16.5 % — ABNORMAL HIGH (ref 11.5–15.5)
RDW: 16.5 % — ABNORMAL HIGH (ref 11.5–15.5)
RDW: 16.9 % — ABNORMAL HIGH (ref 11.5–15.5)
WBC: 19.1 10*3/uL — ABNORMAL HIGH (ref 4.0–10.5)
WBC: 15 10*3/uL — ABNORMAL HIGH (ref 4.0–10.5)
WBC: 13.5 10*3/uL — ABNORMAL HIGH (ref 4.0–10.5)
nRBC: 0 % (ref 0.0–0.2)
nRBC: 0 % (ref 0.0–0.2)
nRBC: 0 % (ref 0.0–0.2)

## 2023-03-13 LAB — PREPARE RBC (CROSSMATCH)

## 2023-03-13 SURGERY — Surgical Case
Anesthesia: Spinal

## 2023-03-13 SURGERY — Surgical Case
Anesthesia: Regional

## 2023-03-13 MED ORDER — ONDANSETRON HCL 4 MG/2ML IJ SOLN: 4.0000 mg | Freq: Once | INTRAMUSCULAR | Status: DC | PRN

## 2023-03-13 MED ORDER — METHYLERGONOVINE MALEATE 0.2 MG PO TABS
0.2000 mg | ORAL_TABLET | Freq: Three times a day (TID) | ORAL | Status: AC
  Administered 2023-03-13 – 2023-03-14 (×3): 0.2 mg via ORAL
  Filled 2023-03-13 (×3): qty 1

## 2023-03-13 MED ORDER — ACETAMINOPHEN 325 MG PO TABS
650.0000 mg | ORAL_TABLET | ORAL | Status: DC | PRN
  Administered 2023-03-15 – 2023-03-16 (×3): 650 mg via ORAL
  Filled 2023-03-13 (×3): qty 2

## 2023-03-13 MED ORDER — SODIUM CHLORIDE 0.9 % IV SOLN: 10.0000 mL/h | Freq: Once | INTRAVENOUS | Status: DC

## 2023-03-13 MED ORDER — FENTANYL CITRATE (PF) 100 MCG/2ML IJ SOLN
INTRAMUSCULAR | Status: DC | PRN
  Administered 2023-03-13: 50 ug via INTRATHECAL
  Administered 2023-03-13: 15 ug via INTRATHECAL
  Administered 2023-03-13 (×2): 50 ug via INTRATHECAL

## 2023-03-13 MED ORDER — OXYTOCIN-SODIUM CHLORIDE 30-0.9 UT/500ML-% IV SOLN
INTRAVENOUS | Status: DC | PRN
  Administered 2023-03-13: 300 mL via INTRAVENOUS

## 2023-03-13 MED ORDER — MISOPROSTOL 200 MCG PO TABS
800.0000 ug | ORAL_TABLET | Freq: Once | ORAL | Status: AC
  Administered 2023-03-13: 800 ug via RECTAL

## 2023-03-13 MED ORDER — KETOROLAC TROMETHAMINE 30 MG/ML IJ SOLN: 30.0000 mg | Freq: Four times a day (QID) | INTRAMUSCULAR | Status: DC | PRN

## 2023-03-13 MED ORDER — DEXMEDETOMIDINE HCL IN NACL 80 MCG/20ML IV SOLN
INTRAVENOUS | Status: DC | PRN
Start: 2023-03-13 — End: 2023-03-13
  Administered 2023-03-13: 8 ug via INTRAVENOUS
  Administered 2023-03-13: 12 ug via INTRAVENOUS
  Administered 2023-03-13 (×2): 8 ug via INTRAVENOUS

## 2023-03-13 MED ORDER — OXYTOCIN-SODIUM CHLORIDE 30-0.9 UT/500ML-% IV SOLN
INTRAVENOUS | Status: AC
  Filled 2023-03-13: qty 500

## 2023-03-13 MED ORDER — MENTHOL 3 MG MT LOZG: 1.0000 | LOZENGE | OROMUCOSAL | Status: DC | PRN

## 2023-03-13 MED ORDER — FENTANYL CITRATE (PF) 100 MCG/2ML IJ SOLN
50.0000 ug | Freq: Once | INTRAMUSCULAR | Status: AC
  Administered 2023-03-13: 50 ug via INTRAVENOUS

## 2023-03-13 MED ORDER — AMISULPRIDE (ANTIEMETIC) 5 MG/2ML IV SOLN
10.0000 mg | Freq: Once | INTRAVENOUS | Status: DC
  Filled 2023-03-13: qty 4

## 2023-03-13 MED ORDER — AMISULPRIDE (ANTIEMETIC) 5 MG/2ML IV SOLN: 10.0000 mg | Freq: Once | INTRAVENOUS | Status: DC | PRN

## 2023-03-13 MED ORDER — LACTATED RINGERS IV SOLN: INTRAVENOUS | Status: DC | PRN

## 2023-03-13 MED ORDER — PRENATAL MULTIVITAMIN CH
1.0000 | ORAL_TABLET | Freq: Every day | ORAL | Status: DC
  Administered 2023-03-14 – 2023-03-16 (×3): 1 via ORAL
  Filled 2023-03-13 (×3): qty 1

## 2023-03-13 MED ORDER — OXYCODONE-ACETAMINOPHEN 5-325 MG PO TABS
1.0000 | ORAL_TABLET | ORAL | Status: DC | PRN
  Administered 2023-03-14 – 2023-03-15 (×4): 1 via ORAL
  Administered 2023-03-15: 2 via ORAL
  Administered 2023-03-15: 1 via ORAL
  Administered 2023-03-16: 2 via ORAL
  Filled 2023-03-13: qty 1
  Filled 2023-03-13 (×2): qty 2
  Filled 2023-03-13 (×4): qty 1

## 2023-03-13 MED ORDER — MORPHINE SULFATE (PF) 0.5 MG/ML IJ SOLN
INTRAMUSCULAR | Status: DC | PRN
  Administered 2023-03-13: 150 ug via INTRATHECAL

## 2023-03-13 MED ORDER — ACETAMINOPHEN 10 MG/ML IV SOLN
INTRAVENOUS | Status: DC | PRN
  Administered 2023-03-13: 1000 mg via INTRAVENOUS

## 2023-03-13 MED ORDER — POVIDONE-IODINE 10 % EX SWAB
2.0000 | Freq: Once | CUTANEOUS | Status: AC
  Administered 2023-03-13: 2 via TOPICAL

## 2023-03-13 MED ORDER — CEFAZOLIN SODIUM-DEXTROSE 2-3 GM-%(50ML) IV SOLR
INTRAVENOUS | Status: DC | PRN
  Administered 2023-03-13: 2 g via INTRAVENOUS

## 2023-03-13 MED ORDER — MEPERIDINE HCL 25 MG/ML IJ SOLN
6.2500 mg | INTRAMUSCULAR | Status: DC | PRN
  Administered 2023-03-13: 6.25 mg via INTRAVENOUS

## 2023-03-13 MED ORDER — WITCH HAZEL-GLYCERIN EX PADS: 1.0000 | MEDICATED_PAD | CUTANEOUS | Status: DC | PRN

## 2023-03-13 MED ORDER — TRANEXAMIC ACID-NACL 1000-0.7 MG/100ML-% IV SOLN
INTRAVENOUS | Status: AC
  Filled 2023-03-13: qty 100

## 2023-03-13 MED ORDER — SENNOSIDES-DOCUSATE SODIUM 8.6-50 MG PO TABS
2.0000 | ORAL_TABLET | ORAL | Status: DC
  Administered 2023-03-14 – 2023-03-16 (×3): 2 via ORAL
  Filled 2023-03-13 (×3): qty 2

## 2023-03-13 MED ORDER — NALOXONE HCL 4 MG/10ML IJ SOLN: 1.0000 ug/kg/h | INTRAVENOUS | Status: DC | PRN

## 2023-03-13 MED ORDER — MEASLES, MUMPS & RUBELLA VAC IJ SOLR: 0.5000 mL | Freq: Once | INTRAMUSCULAR | Status: DC

## 2023-03-13 MED ORDER — OXYTOCIN-SODIUM CHLORIDE 30-0.9 UT/500ML-% IV SOLN: 2.5000 [IU]/h | INTRAVENOUS | Status: AC

## 2023-03-13 MED ORDER — OXYCODONE HCL 5 MG PO TABS: 5.0000 mg | ORAL_TABLET | Freq: Once | ORAL | Status: DC | PRN

## 2023-03-13 MED ORDER — HYDROMORPHONE HCL 1 MG/ML IJ SOLN
0.2500 mg | INTRAMUSCULAR | Status: DC | PRN
  Administered 2023-03-13 (×2): 0.25 mg via INTRAVENOUS

## 2023-03-13 MED ORDER — DEXAMETHASONE SODIUM PHOSPHATE 10 MG/ML IJ SOLN
INTRAMUSCULAR | Status: DC | PRN
  Administered 2023-03-13: 10 mg via INTRAVENOUS

## 2023-03-13 MED ORDER — PROMETHAZINE HCL 25 MG/ML IJ SOLN
INTRAMUSCULAR | Status: DC | PRN
  Administered 2023-03-13: 6.25 mg via INTRAVENOUS

## 2023-03-13 MED ORDER — COCONUT OIL OIL: 1.0000 | TOPICAL_OIL | Status: DC | PRN

## 2023-03-13 MED ORDER — SODIUM CHLORIDE 0.9 % IV SOLN: INTRAVENOUS | Status: DC

## 2023-03-13 MED ORDER — CARBOPROST TROMETHAMINE 250 MCG/ML IM SOLN
INTRAMUSCULAR | Status: AC
  Filled 2023-03-13: qty 1

## 2023-03-13 MED ORDER — SCOPOLAMINE 1 MG/3DAYS TD PT72: 1.0000 | MEDICATED_PATCH | Freq: Once | TRANSDERMAL | Status: DC

## 2023-03-13 MED ORDER — FENTANYL CITRATE (PF) 100 MCG/2ML IJ SOLN
INTRAMUSCULAR | Status: AC
  Filled 2023-03-13: qty 2

## 2023-03-13 MED ORDER — MORPHINE SULFATE (PF) 0.5 MG/ML IJ SOLN
INTRAMUSCULAR | Status: AC
  Filled 2023-03-13: qty 10

## 2023-03-13 MED ORDER — ACETAMINOPHEN 10 MG/ML IV SOLN
INTRAVENOUS | Status: AC
  Filled 2023-03-13: qty 100

## 2023-03-13 MED ORDER — PROMETHAZINE HCL 25 MG/ML IJ SOLN
INTRAMUSCULAR | Status: AC
  Filled 2023-03-13: qty 1

## 2023-03-13 MED ORDER — DIPHENHYDRAMINE HCL 25 MG PO CAPS: 25.0000 mg | ORAL_CAPSULE | ORAL | Status: DC | PRN

## 2023-03-13 MED ORDER — MEPERIDINE HCL 25 MG/ML IJ SOLN
INTRAMUSCULAR | Status: AC
  Filled 2023-03-13: qty 1

## 2023-03-13 MED ORDER — METHYLERGONOVINE MALEATE 0.2 MG/ML IJ SOLN
0.2000 mg | Freq: Once | INTRAMUSCULAR | Status: AC
  Administered 2023-03-13: 0.2 mg via INTRAMUSCULAR

## 2023-03-13 MED ORDER — DIPHENHYDRAMINE HCL 50 MG/ML IJ SOLN: 12.5000 mg | INTRAMUSCULAR | Status: DC | PRN

## 2023-03-13 MED ORDER — NALOXONE HCL 0.4 MG/ML IJ SOLN: 0.4000 mg | INTRAMUSCULAR | Status: DC | PRN

## 2023-03-13 MED ORDER — ZOLPIDEM TARTRATE 5 MG PO TABS: 5.0000 mg | ORAL_TABLET | Freq: Every evening | ORAL | Status: DC | PRN

## 2023-03-13 MED ORDER — ALBUMIN HUMAN 5 % IV SOLN
INTRAVENOUS | Status: AC
  Filled 2023-03-13: qty 250

## 2023-03-13 MED ORDER — OXYCODONE HCL 5 MG/5ML PO SOLN: 5.0000 mg | Freq: Once | ORAL | Status: DC | PRN

## 2023-03-13 MED ORDER — MISOPROSTOL 200 MCG PO TABS
ORAL_TABLET | ORAL | Status: AC
  Filled 2023-03-13: qty 4

## 2023-03-13 MED ORDER — CALCIUM CHLORIDE 10 % IV SOLN
INTRAVENOUS | Status: DC | PRN
  Administered 2023-03-13: 1 g via INTRAVENOUS

## 2023-03-13 MED ORDER — DIBUCAINE (PERIANAL) 1 % EX OINT: 1.0000 | TOPICAL_OINTMENT | CUTANEOUS | Status: DC | PRN

## 2023-03-13 MED ORDER — DIPHENHYDRAMINE HCL 25 MG PO CAPS: 25.0000 mg | ORAL_CAPSULE | Freq: Four times a day (QID) | ORAL | Status: DC | PRN

## 2023-03-13 MED ORDER — ONDANSETRON HCL 4 MG/2ML IJ SOLN: 4.0000 mg | Freq: Three times a day (TID) | INTRAMUSCULAR | Status: DC | PRN

## 2023-03-13 MED ORDER — PHENYLEPHRINE HCL-NACL 20-0.9 MG/250ML-% IV SOLN
INTRAVENOUS | Status: DC | PRN
  Administered 2023-03-13: 60 ug/min via INTRAVENOUS

## 2023-03-13 MED ORDER — DEXAMETHASONE SODIUM PHOSPHATE 10 MG/ML IJ SOLN
INTRAMUSCULAR | Status: AC
  Filled 2023-03-13: qty 1

## 2023-03-13 MED ORDER — BUPIVACAINE IN DEXTROSE 0.75-8.25 % IT SOLN
INTRATHECAL | Status: DC | PRN
  Administered 2023-03-13: 1.4 mL via INTRATHECAL

## 2023-03-13 MED ORDER — METHYLERGONOVINE MALEATE 0.2 MG/ML IJ SOLN
INTRAMUSCULAR | Status: AC
  Filled 2023-03-13: qty 1

## 2023-03-13 MED ORDER — METHYLERGONOVINE MALEATE 0.2 MG/ML IJ SOLN
INTRAMUSCULAR | Status: DC | PRN
  Administered 2023-03-13: .2 mg via INTRAMUSCULAR

## 2023-03-13 MED ORDER — SIMETHICONE 80 MG PO CHEW
80.0000 mg | CHEWABLE_TABLET | ORAL | Status: DC | PRN
  Administered 2023-03-14 – 2023-03-15 (×2): 80 mg via ORAL
  Filled 2023-03-13 (×2): qty 1

## 2023-03-13 MED ORDER — STERILE WATER FOR IRRIGATION IR SOLN
Status: DC | PRN
  Administered 2023-03-13: 1

## 2023-03-13 MED ORDER — ONDANSETRON HCL 4 MG/2ML IJ SOLN
INTRAMUSCULAR | Status: DC | PRN
  Administered 2023-03-13: 4 mg via INTRAVENOUS

## 2023-03-13 MED ORDER — HYDROMORPHONE HCL 1 MG/ML IJ SOLN
INTRAMUSCULAR | Status: AC
  Filled 2023-03-13: qty 0.5

## 2023-03-13 MED ORDER — SODIUM CHLORIDE 0.9 % IV SOLN
INTRAVENOUS | Status: AC
  Filled 2023-03-13: qty 2

## 2023-03-13 MED ORDER — ACETAMINOPHEN 500 MG PO TABS
1000.0000 mg | ORAL_TABLET | Freq: Four times a day (QID) | ORAL | Status: AC
  Administered 2023-03-13 – 2023-03-14 (×3): 1000 mg via ORAL
  Filled 2023-03-13 (×3): qty 2

## 2023-03-13 MED ORDER — SODIUM CHLORIDE 0.9% FLUSH: 3.0000 mL | INTRAVENOUS | Status: DC | PRN

## 2023-03-13 MED ORDER — KETOROLAC TROMETHAMINE 30 MG/ML IJ SOLN
30.0000 mg | Freq: Four times a day (QID) | INTRAMUSCULAR | Status: AC
  Administered 2023-03-13 – 2023-03-14 (×3): 30 mg via INTRAVENOUS
  Filled 2023-03-13 (×3): qty 1

## 2023-03-13 MED ORDER — ALBUMIN HUMAN 5 % IV SOLN
INTRAVENOUS | Status: DC | PRN
Start: 2023-03-13 — End: 2023-03-13

## 2023-03-13 MED ORDER — LACTATED RINGERS IV SOLN: INTRAVENOUS | Status: DC

## 2023-03-13 MED ORDER — SIMETHICONE 80 MG PO CHEW
80.0000 mg | CHEWABLE_TABLET | Freq: Three times a day (TID) | ORAL | Status: DC
  Administered 2023-03-14 – 2023-03-16 (×7): 80 mg via ORAL
  Filled 2023-03-13 (×7): qty 1

## 2023-03-13 MED ORDER — PHENYLEPHRINE HCL-NACL 20-0.9 MG/250ML-% IV SOLN
INTRAVENOUS | Status: AC
  Filled 2023-03-13: qty 250

## 2023-03-13 MED ORDER — IBUPROFEN 600 MG PO TABS
600.0000 mg | ORAL_TABLET | Freq: Four times a day (QID) | ORAL | Status: DC
  Administered 2023-03-14 – 2023-03-16 (×8): 600 mg via ORAL
  Filled 2023-03-13 (×8): qty 1

## 2023-03-13 MED ORDER — ONDANSETRON HCL 4 MG/2ML IJ SOLN
INTRAMUSCULAR | Status: AC
  Filled 2023-03-13: qty 2

## 2023-03-13 MED ORDER — TRANEXAMIC ACID-NACL 1000-0.7 MG/100ML-% IV SOLN
1000.0000 mg | Freq: Once | INTRAVENOUS | Status: AC
  Administered 2023-03-13 (×2): 1000 mg via INTRAVENOUS

## 2023-03-13 MED ORDER — SODIUM CHLORIDE 0.9% IV SOLUTION: Freq: Once | INTRAVENOUS | Status: DC

## 2023-03-13 MED ORDER — SODIUM CHLORIDE 0.9 % IV SOLN
2.0000 g | INTRAVENOUS | Status: AC
  Administered 2023-03-13: 2 g via INTRAVENOUS

## 2023-03-13 MED ORDER — KETOROLAC TROMETHAMINE 30 MG/ML IJ SOLN: 30.0000 mg | Freq: Once | INTRAMUSCULAR | Status: DC | PRN

## 2023-03-13 MED ORDER — TETANUS-DIPHTH-ACELL PERTUSSIS 5-2.5-18.5 LF-MCG/0.5 IM SUSY: 0.5000 mL | PREFILLED_SYRINGE | Freq: Once | INTRAMUSCULAR | Status: DC

## 2023-03-13 SURGICAL SUPPLY — 26 items
APL PRP STRL LF DISP 70% ISPRP (MISCELLANEOUS) ×2
CHLORAPREP W/TINT 26 (MISCELLANEOUS) ×2 IMPLANT
CLAMP UMBILICAL CORD (MISCELLANEOUS) ×1 IMPLANT
CLIP FILSHIE TUBAL LIGA STRL (Clip) IMPLANT
CLOTH BEACON ORANGE TIMEOUT ST (SAFETY) ×1 IMPLANT
DRSG OPSITE POSTOP 4X10 (GAUZE/BANDAGES/DRESSINGS) ×1 IMPLANT
ELECT REM PT RETURN 9FT ADLT (ELECTROSURGICAL) ×1
ELECTRODE REM PT RTRN 9FT ADLT (ELECTROSURGICAL) ×1 IMPLANT
EXTRACTOR VACUUM M CUP 4 TUBE (SUCTIONS) IMPLANT
GLOVE BIOGEL PI IND STRL 7.0 (GLOVE) ×1 IMPLANT
GLOVE SURG ORTHO 8.0 STRL STRW (GLOVE) ×1 IMPLANT
GOWN STRL REUS W/TWL LRG LVL3 (GOWN DISPOSABLE) ×2 IMPLANT
KIT ABG SYR 3ML LUER SLIP (SYRINGE) ×1 IMPLANT
NDL HYPO 25X5/8 SAFETYGLIDE (NEEDLE) ×1 IMPLANT
NEEDLE HYPO 25X5/8 SAFETYGLIDE (NEEDLE) ×1 IMPLANT
NS IRRIG 1000ML POUR BTL (IV SOLUTION) ×1 IMPLANT
PACK C SECTION WH (CUSTOM PROCEDURE TRAY) ×1 IMPLANT
PAD OB MATERNITY 4.3X12.25 (PERSONAL CARE ITEMS) ×1 IMPLANT
SUT MNCRL 0 VIOLET CTX 36 (SUTURE) ×3 IMPLANT
SUT MON AB 4-0 PS1 27 (SUTURE) ×1 IMPLANT
SUT PDS AB 1 CT 36 (SUTURE) IMPLANT
SUT VIC AB 1 CTX 36 (SUTURE)
SUT VIC AB 1 CTX36XBRD ANBCTRL (SUTURE) IMPLANT
TOWEL OR 17X24 6PK STRL BLUE (TOWEL DISPOSABLE) ×1 IMPLANT
TRAY FOLEY W/BAG SLVR 14FR LF (SET/KITS/TRAYS/PACK) ×1 IMPLANT
WATER STERILE IRR 1000ML POUR (IV SOLUTION) ×1 IMPLANT

## 2023-03-13 NOTE — Anesthesia Procedure Notes (Signed)
Spinal  Patient location during procedure: OR Start time: 03/13/2023 10:09 AM End time: 03/13/2023 10:13 AM Reason for block: surgical anesthesia Staffing Performed: anesthesiologist  Anesthesiologist: Lannie Fields, DO Performed by: Lannie Fields, DO Authorized by: Lannie Fields, DO   Preanesthetic Checklist Completed: patient identified, IV checked, risks and benefits discussed, surgical consent, monitors and equipment checked, pre-op evaluation and timeout performed Spinal Block Patient position: sitting Prep: DuraPrep and site prepped and draped Patient monitoring: cardiac monitor, continuous pulse ox and blood pressure Approach: midline Location: L3-4 Injection technique: single-shot Needle Needle type: Pencan  Needle gauge: 24 G Needle length: 9 cm Assessment Sensory level: T6 Events: CSF return Additional Notes Functioning IV was confirmed and monitors were applied. Sterile prep and drape, including hand hygiene and sterile gloves were used. The patient was positioned and the spine was prepped. The skin was anesthetized with lidocaine.  Free flow of clear CSF was obtained prior to injecting local anesthetic into the CSF.  The spinal needle aspirated freely following injection.  The needle was carefully withdrawn.  The patient tolerated the procedure well.

## 2023-03-13 NOTE — Progress Notes (Signed)
Patient ID: Melinda Nichols, female   DOB: 01-Jul-1981, 42 y.o.   MRN: [id number] PP atony began in OR after uncomplicated C/S Bring total EBL in OR to 1700cc (including procedure)  In PACU, would massage to firm and then began trickling.  Received Methergine, Cytotec and received 2U PRBC Abd soft, nt and incision CD&I  Began increase vaginal bleeding, and exam revealed lower segment very full and cx dilated to 3cm.  Manually extracted clots until LUS was firm and no clots remaining.  EBL was 1600cc by Triton.  DIC panel sent, and will give 2 more U PRBCs At this time, bleeding is minimal and uterine fundus firm at 15 weeks size. Will continue to closely monitor.  Her vitals are stable.  Consider JADA if recurs

## 2023-03-13 NOTE — Op Note (Signed)
Cesarean Section Procedure Note  Pre-operative Diagnosis: IUP twins at 36 2/7, Previous C/S for repeat, Breech/Vertex presentation, IUGR of A, Cholestasis of pregnancy  Post-operative Diagnosis: same  Surgeon: Turner Daniels   Assistants: Surg Mar Daring  Anesthesia: spinal  Procedure:  Low Segment Transverse cesarean section  Procedure Details  The patient was seen in the Holding Room. The risks, benefits, complications, treatment options, and expected outcomes were discussed with the patient.  The patient concurred with the proposed plan, giving informed consent.  The site of surgery properly noted/marked.. A Time Out was held and the above information confirmed.  After induction of anesthesia, the patient was draped and prepped in the usual sterile manner. A Pfannenstiel incision was made and carried down through the subcutaneous tissue to the fascia. Fascial incision was made and extended transversely. The fascia was separated from the underlying rectus tissue superiorly and inferiorly. The peritoneum was identified and entered. Peritoneal incision was extended longitudinally. The utero-vesical peritoneal reflection was incised transversely and the bladder flap was bluntly freed from the lower uterine segment. A low transverse uterine incision was made. Delivered from Roxton Breech presentation was a baby A with Apgar scores of 8 at one minute and 9 at five minutes. After the umbilical cord was clamped and cut cord blood was obtained for evaluation and cord clamped placed on the cord.  AROM of B and delivered from the Vertex presentation Apgars, 9,9.  Cord blood obtained. The placentas werefe removed intact and appeared normal. The uterine outline, tubes and ovaries appeared normal. The uterine incision was closed with running locked sutures of 0 monocryl and imbricated with 0 monocryl. Hemostasis was observed. Lavage was carried out until clear. The peritoneum was then closed with 0  monocryl and rectus muscles plicated in the midline.  After hemostasis was assured, the fascia was then reapproximated with running sutures of 0 Vicryl. Irrigation was applied and after adequate hemostasis was assured, the skin was reapproximated with subcutaneous sutures using 4-0 monocryl.  Instrument, sponge, and needle counts were correct prior the abdominal closure and at the conclusion of the case. The patient received 2 grams cefotetan preoperatively.  Findings: Viable females  Estimated Blood Loss:  1230cc         Specimens: Placenta was sent to labor and delivery         Complications:  None

## 2023-03-13 NOTE — H&P (Signed)
Melinda Nichols is a 42 y.o. female presenting for repeat C/S at 36 2/7 Pregnancy complicated by twins di/di females with FGR in both followed by MFM.  Last [place] 03/09/23 Fetus A Breech and EFW 1.8% and Fetus B Vertex and 13%tile.  Reassuring BPP.  Pregnancy also complicated by ICP.  Early fetal ECHO showed possible VSD in twin A. OB History     Gravida  4   Para  2   Term  1   Preterm  1   AB  1   Living  2      SAB  1   IAB      Ectopic      Multiple  0   Live Births  2          Past Medical History:  Diagnosis Date   Anemia    Blood transfusion without reported diagnosis    Cholestasis during pregnancy    Endometriosis    GAD (generalized anxiety disorder)    GERD (gastroesophageal reflux disease)    Iron deficiency anemia 03/23/2021   Pain of left calf 03/23/2021   Past Surgical History:  Procedure Laterality Date   ABDOMINAL SURGERY     CESAREAN SECTION  2003   CESAREAN SECTION N/A 06/10/2020   Procedure: REPEAT CESAREAN SECTION EDC: 07-22-20 PREVIOUS X 1  ALLERG: NKDA;  Surgeon: Lyn Henri, MD;  Location: [facility] LD ORS;  Service: Obstetrics;  Laterality: N/A;   Family History: family history includes Asthma in her daughter; Bipolar disorder in her brother, father, and mother; Cancer in her sister; Heart disease in her daughter. Social History:  reports that she has never smoked. She has never used smokeless tobacco. She reports that she does not drink alcohol and does not use drugs.     Maternal Diabetes: No Genetic Screening: Normal Maternal Ultrasounds/Referrals: IUGR and Cardiac defect possible VSD twin A Fetal Ultrasounds or other Referrals:  Referred to Materal Fetal Medicine  Maternal Substance Abuse:  No Significant Maternal Medications:  None Significant Maternal Lab Results:  Group B Strep negative Number of Prenatal Visits:greater than 3 verified prenatal visits Other Comments:  None  Review of Systems History   Last menstrual  period 07/02/2022, unknown if currently breastfeeding. Exam Physical Exam  Vitals and nursing note reviewed. Exam conducted with a chaperone present.  Constitutional:      Appearance: Normal appearance.  HENT:     Head: Normocephalic.  Eyes:     Pupils: Pupils are equal, round, and reactive to light.  Cardiovascular:     Rate and Rhythm: Normal rate and regular rhythm.     Pulses: Normal pulses.  Abdominal:     General: Abdomen is Gravid, nontender Neurological:     Mental Status: She is alert. Prenatal labs: ABO, Rh: --/--/O POS (08/19 1006) Antibody: NEG (08/19 1006) Rubella: Immune (02/19 0000) RPR: NON REACTIVE (08/19 1000)  HBsAg: Negative (02/19 0000)  HIV: Non-reactive (02/19 0000)  GBS:     Assessment/Plan: IUP at 36 + weeks Previous C/S desires repeat and malpresentation of twins Breech/Vertex IUGR in Fetus A Possible VSD in Fetus A ICP with normalization of bile acids on ursodiol Risks and benefits of C/S were discussed.  All questions were answered and informed consent was obtained.  Plan to proceed with low segment transverse Cesarean Section.   Turner Daniels 03/13/2023, 8:16 AM

## 2023-03-13 NOTE — H&P (Signed)
  The note originally documented on this encounter has been moved the the encounter in which it belongs.  

## 2023-03-13 NOTE — Addendum Note (Signed)
Addendum  created 03/13/23 1804 by Lannie Fields, DO   Clinical Note Signed, Intraprocedure Blocks edited, LDA updated via procedure documentation, SmartForm saved

## 2023-03-13 NOTE — Anesthesia Postprocedure Evaluation (Addendum)
Anesthesia Post Note  Patient: Melinda Nichols  Procedure(s) Performed: CESAREAN SECTION     Patient location during evaluation: PACU Anesthesia Type: Spinal Level of consciousness: awake and alert and oriented Pain management: pain level controlled Vital Signs Assessment: post-procedure vital signs reviewed and stable Respiratory status: spontaneous breathing, nonlabored ventilation and respiratory function stable Cardiovascular status: blood pressure returned to baseline and stable Postop Assessment: no headache, no backache, spinal receding and patient able to bend at knees Anesthetic complications: no Comments: Code hemorrhage, about 4L blood loss- received multiple units of blood- see chart   No notable events documented.  Last Vitals:  Vitals:   03/13/23 1630 03/13/23 1724  BP:  130/85  Pulse: (!) 54 (!) 58  Resp: 11 16  Temp:  36.8 C  SpO2: 98% 100%    Last Pain:  Vitals:   03/13/23 1724  TempSrc: Oral  PainSc: 2                  Lannie Fields

## 2023-03-13 NOTE — Transfer of Care (Signed)
Immediate Anesthesia Transfer of Care Note  Patient: Melinda Nichols  Procedure(s) Performed: CESAREAN SECTION  Patient Location: PACU  Anesthesia Type:Spinal  Level of Consciousness: awake  Airway & Oxygen Therapy: Patient Spontanous Breathing and Patient connected to nasal cannula oxygen  Post-op Assessment: Report given to RN and Post -op Vital signs reviewed and stable  Post vital signs: Reviewed and stable  Last Vitals:  Vitals Value Taken Time  BP 127/60 03/13/23 1159  Temp 36 C 03/13/23 1145  Pulse 48 03/13/23 1203  Resp 13 03/13/23 1203  SpO2 100 % 03/13/23 1203  Vitals shown include unfiled device data.  Last Pain:  Vitals:   03/13/23 1145  TempSrc: Temporal  PainSc:          Complications: No notable events documented.

## 2023-03-13 NOTE — Progress Notes (Signed)
Patient ID: Melinda Nichols, female   DOB: [birth year], 42 y.o.   MRN: [id number] [name] began having increased bleeding recently, still in PACU She has had 4u PRBC, 2U plts, 1 cyro BP/Pulse stable currently and this is monitored and administered by Anesthesiologist  I evacuated 260cc of clots from the lower segment, Banjo currette with no further clots JADA attempted and could not pass above LUS due to firm uterus.  Attempted also by Dr Adrian Blackwater without success. Will repeat TXA and methergine and monitor bleeding. Total after OR is 2406cc

## 2023-03-13 NOTE — Anesthesia Preprocedure Evaluation (Addendum)
Anesthesia Evaluation  Patient identified by MRN, date of birth, ID band Patient awake    Reviewed: Allergy & Precautions, NPO status , Patient's Chart, lab work & pertinent test results  Airway Mallampati: III  TM Distance: >3 FB Neck ROM: Full    Dental no notable dental hx.    Pulmonary neg pulmonary ROS   Pulmonary exam normal breath sounds clear to auscultation       Cardiovascular negative cardio ROS Normal cardiovascular exam Rhythm:Regular Rate:Normal     Neuro/Psych  PSYCHIATRIC DISORDERS Anxiety     negative neurological ROS     GI/Hepatic Neg liver ROS,GERD  Controlled and Medicated,,  Endo/Other  negative endocrine ROS    Renal/GU negative Renal ROS  negative genitourinary   Musculoskeletal negative musculoskeletal ROS (+)    Abdominal   Peds  Hematology  (+) Blood dyscrasia, anemia Hb 11.2, plt 245   Anesthesia Other Findings   Reproductive/Obstetrics (+) Pregnancy Twins Previous section x 2                             Anesthesia Physical Anesthesia Plan  ASA: 2  Anesthesia Plan: Spinal   Post-op Pain Management: Regional block, Toradol IV (intra-op)* and Ofirmev IV (intra-op)*   Induction:   PONV Risk Score and Plan: 3 and Ondansetron, Dexamethasone and Treatment may vary due to age or medical condition  Airway Management Planned: Natural Airway and Nasal Cannula  Additional Equipment: None  Intra-op Plan:   Post-operative Plan:   Informed Consent: I have reviewed the patients History and Physical, chart, labs and discussed the procedure including the risks, benefits and alternatives for the proposed anesthesia with the patient or authorized representative who has indicated his/her understanding and acceptance.       Plan Discussed with: CRNA  Anesthesia Plan Comments:        Anesthesia Quick Evaluation

## 2023-03-13 NOTE — Anesthesia Procedure Notes (Addendum)
Arterial Line Insertion Start/End8/20/2024 2:19 AM, 03/13/2023 2:29 PM Performed by: Lannie Fields, DO, anesthesiologist  Patient location: PACU. Preanesthetic checklist: patient identified, IV checked, site marked, risks and benefits discussed, surgical consent, monitors and equipment checked, pre-op evaluation, timeout performed and anesthesia consent Lidocaine 1% used for infiltration Right, radial was placed Catheter size: 20 G Hand hygiene performed  and maximum sterile barriers used   Attempts: 1 Procedure performed using ultrasound guided (unable to print image) technique. Ultrasound Notes:anatomy identified, needle tip was noted to be adjacent to the nerve/plexus identified and no ultrasound evidence of intravascular and/or intraneural injection Following insertion, dressing applied and Biopatch. Post procedure assessment: normal and unchanged  Patient tolerated the procedure well with no immediate complications.

## 2023-03-14 LAB — CBC
HCT: 27.3 % — ABNORMAL LOW (ref 36.0–46.0)
Hemoglobin: 9.6 g/dL — ABNORMAL LOW (ref 12.0–15.0)
MCH: 29.5 pg (ref 26.0–34.0)
MCHC: 35.2 g/dL (ref 30.0–36.0)
MCV: 84 fL (ref 80.0–100.0)
Platelets: 150 10*3/uL (ref 150–400)
RBC: 3.25 MIL/uL — ABNORMAL LOW (ref 3.87–5.11)
RDW: 17.3 % — ABNORMAL HIGH (ref 11.5–15.5)
WBC: 11.7 10*3/uL — ABNORMAL HIGH (ref 4.0–10.5)
nRBC: 0 % (ref 0.0–0.2)

## 2023-03-14 LAB — TYPE AND SCREEN
ABO/RH(D): O POS
Antibody Screen: NEGATIVE
Unit division: 0
Unit division: 0
Unit division: 0
Unit division: 0
Unit division: 0
Unit division: 0

## 2023-03-14 LAB — BPAM CRYOPRECIPITATE
Blood Product Expiration Date: 202408242359
ISSUE DATE / TIME: 202408201342
Unit Type and Rh: 5100

## 2023-03-14 LAB — BPAM RBC
Blood Product Expiration Date: 202409062359
Blood Product Expiration Date: 202409062359
Blood Product Expiration Date: 202409102359
Blood Product Expiration Date: 202409132359
Blood Product Expiration Date: 202409132359
Blood Product Expiration Date: 202409142359
ISSUE DATE / TIME: 202408201151
ISSUE DATE / TIME: 202408201151
ISSUE DATE / TIME: 202408201230
ISSUE DATE / TIME: 202408201230
ISSUE DATE / TIME: 202408201447
ISSUE DATE / TIME: 202408201447
Unit Type and Rh: 5100
Unit Type and Rh: 5100
Unit Type and Rh: 5100
Unit Type and Rh: 5100
Unit Type and Rh: 5100
Unit Type and Rh: 5100

## 2023-03-14 LAB — PREPARE PLATELET PHERESIS: Unit division: 0

## 2023-03-14 LAB — PREPARE FRESH FROZEN PLASMA

## 2023-03-14 LAB — BPAM FFP
Blood Product Expiration Date: 202408242359
Blood Product Expiration Date: 202408242359
Blood Product Expiration Date: 202408252359
ISSUE DATE / TIME: 202408201257
ISSUE DATE / TIME: 202408201257
ISSUE DATE / TIME: 202408201444
Unit Type and Rh: 6200
Unit Type and Rh: 6200
Unit Type and Rh: 6200

## 2023-03-14 LAB — PREPARE CRYOPRECIPITATE: Unit division: 0

## 2023-03-14 LAB — BPAM PLATELET PHERESIS
Blood Product Expiration Date: 202408222359
ISSUE DATE / TIME: 202408201344
Unit Type and Rh: 5100

## 2023-03-14 MED ORDER — CYCLOBENZAPRINE HCL 10 MG PO TABS: 10.0000 mg | ORAL_TABLET | Freq: Three times a day (TID) | ORAL | Status: DC | PRN

## 2023-03-14 NOTE — Progress Notes (Signed)
Subjective: Postpartum Day  1: Cesarean Delivery Patient reports tolerating PO.   Not up on feet yet Objective: Vital signs in last 24 hours: Temp:  [96.8 F (36 C)-98.7 F (37.1 C)] 98 F (36.7 C) (08/21 0752) Pulse Rate:  [46-97] 57 (08/21 0752) Resp:  [6-27] 18 (08/21 0752) BP: (79-149)/(43-109) 100/60 (08/21 0752) SpO2:  [95 %-100 %] 95 % (08/21 0752)  Physical Exam:  General: alert, cooperative, and no distress Lochia: appropriate Uterine Fundus: firm Incision: healing well DVT Evaluation: No evidence of DVT seen on physical exam.  Recent Labs    03/13/23 2243 03/14/23 0711  HGB 9.8* 9.6*  HCT 28.2* 27.3*    Assessment/Plan: Status post Cesarean section. Postoperative course complicated by PO bleeding, now stable.   She received 4U PRBC, 2 U plts, 1 U cryo. Now completing methergine po x 24 hours Continue current care. CBC in am Roselle Locus II, MD 03/14/2023, 10:15 AM

## 2023-03-14 NOTE — Progress Notes (Signed)
MOB was referred for history of depression/anxiety. * Referral screened out by Clinical Social Worker because none of the following criteria appear to apply: ~ History of anxiety/depression during this pregnancy, or of post-partum depression following prior delivery. ~ Diagnosis of anxiety and/or depression within last 3 years. No concerns noted in OB records. OR * MOB's symptoms currently being treated with medication and/or therapy.  Please contact the Clinical Social Worker if needs arise, by MOB request, or if MOB scores greater than 9/yes to question 10 on Edinburgh Postpartum Depression Screen.   Angel Boyd-Gilyard, MSW, LCSW Clinical Social Work [phone]  

## 2023-03-15 LAB — CBC
HCT: 27.8 % — ABNORMAL LOW (ref 36.0–46.0)
Hemoglobin: 9.4 g/dL — ABNORMAL LOW (ref 12.0–15.0)
MCH: 29.3 pg (ref 26.0–34.0)
MCHC: 33.8 g/dL (ref 30.0–36.0)
MCV: 86.6 fL (ref 80.0–100.0)
Platelets: 171 10*3/uL (ref 150–400)
RBC: 3.21 MIL/uL — ABNORMAL LOW (ref 3.87–5.11)
RDW: 18 % — ABNORMAL HIGH (ref 11.5–15.5)
WBC: 11.1 10*3/uL — ABNORMAL HIGH (ref 4.0–10.5)
nRBC: 0 % (ref 0.0–0.2)

## 2023-03-15 LAB — SURGICAL PATHOLOGY

## 2023-03-15 NOTE — Progress Notes (Signed)
Postpartum Progress Note  Postpartum Day 2 s/p repeat Cesarean section, with postpartum hemorrhage and evacuation of clot with curettage.  Subjective:  Patient reports no overnight events.  She reports well controlled pain, ambulating without difficulty, voiding spontaneously, tolerating PO.  She reports Negative flatus, Negative BM.  Vaginal bleeding is minimal since yesterday.  Objective: Blood pressure 106/64, pulse (!) 52, temperature 98 F (36.7 C), temperature source Oral, resp. rate 18, height 5\' 1"  (1.549 m), weight 64.9 kg, last menstrual period 07/02/2022, SpO2 98%, unknown if currently breastfeeding.  Physical Exam:  General: alert and no distress Lochia: appropriate Abdomen: soft, ATTP Uterine Fundus: firm Incision: dressing in place DVT Evaluation: No evidence of DVT seen on physical exam.  Recent Labs    03/14/23 0711 03/15/23 0718  HGB 9.6* 9.4*  HCT 27.3* 27.8*    Assessment/Plan: Postpartum Day 2, s/p C-section Postpartum hemorrhage, s/p 4u pRBC, 2u PLT, 1 u cryo. Finished 24 hrs PO methergine today HGB stable, vitals reassuring, and no s/s of anemia. Bleeding has been minimal Lactation following Doing well, continue routine postpartum care. Anticipate discharge tomorrow   LOS: 2 days   Lyn Henri 03/15/2023, 8:34 AM

## 2023-03-16 ENCOUNTER — Encounter: Payer: Self-pay | Admitting: Pulmonary Disease

## 2023-03-16 ENCOUNTER — Other Ambulatory Visit (HOSPITAL_COMMUNITY): Payer: Self-pay

## 2023-03-16 ENCOUNTER — Other Ambulatory Visit: Payer: Self-pay

## 2023-03-16 ENCOUNTER — Encounter (HOSPITAL_COMMUNITY): Payer: Self-pay | Admitting: Obstetrics and Gynecology

## 2023-03-16 MED ORDER — IBUPROFEN 600 MG PO TABS
600.0000 mg | ORAL_TABLET | Freq: Four times a day (QID) | ORAL | 0 refills | Status: DC | PRN
  Filled 2023-03-16: qty 30, 8d supply, fill #0

## 2023-03-16 MED ORDER — DOCUSATE SODIUM 100 MG PO CAPS
100.0000 mg | ORAL_CAPSULE | Freq: Two times a day (BID) | ORAL | 1 refills | Status: AC
  Filled 2023-03-16: qty 100, 50d supply, fill #0

## 2023-03-16 MED ORDER — FERROUS SULFATE 325 (65 FE) MG PO TBEC: 325.0000 mg | DELAYED_RELEASE_TABLET | Freq: Two times a day (BID) | ORAL | 2 refills | Status: DC

## 2023-03-16 MED ORDER — OXYCODONE HCL 5 MG PO TABS
5.0000 mg | ORAL_TABLET | ORAL | 0 refills | Status: DC | PRN
  Filled 2023-03-16: qty 15, 3d supply, fill #0

## 2023-03-16 NOTE — Plan of Care (Signed)
  Problem: Education: Goal: Knowledge of General Education information will improve Description: Including pain rating scale, medication(s)/side effects and non-pharmacologic comfort measures Outcome: Adequate for Discharge   Problem: Health Behavior/Discharge Planning: Goal: Ability to manage health-related needs will improve Outcome: Adequate for Discharge   Problem: Clinical Measurements: Goal: Ability to maintain clinical measurements within normal limits will improve Outcome: Adequate for Discharge Goal: Will remain free from infection Outcome: Adequate for Discharge Goal: Diagnostic test results will improve Outcome: Adequate for Discharge Goal: Respiratory complications will improve Outcome: Adequate for Discharge Goal: Cardiovascular complication will be avoided Outcome: Adequate for Discharge   Problem: Activity: Goal: Risk for activity intolerance will decrease Outcome: Adequate for Discharge   Problem: Nutrition: Goal: Adequate nutrition will be maintained Outcome: Adequate for Discharge   Problem: Coping: Goal: Level of anxiety will decrease Outcome: Adequate for Discharge   Problem: Elimination: Goal: Will not experience complications related to bowel motility Outcome: Adequate for Discharge Goal: Will not experience complications related to urinary retention Outcome: Adequate for Discharge   Problem: Pain Managment: Goal: General experience of comfort will improve Outcome: Adequate for Discharge   Problem: Safety: Goal: Ability to remain free from injury will improve Outcome: Adequate for Discharge   Problem: Skin Integrity: Goal: Risk for impaired skin integrity will decrease Outcome: Adequate for Discharge   Problem: Education: Goal: Knowledge of the prescribed therapeutic regimen will improve Outcome: Adequate for Discharge Goal: Understanding of sexual limitations or changes related to disease process or condition will improve Outcome: Adequate  for Discharge Goal: Individualized Educational Video(s) Outcome: Adequate for Discharge   Problem: Self-Concept: Goal: Communication of feelings regarding changes in body function or appearance will improve Outcome: Adequate for Discharge   Problem: Skin Integrity: Goal: Demonstration of wound healing without infection will improve Outcome: Adequate for Discharge   Problem: Education: Goal: Knowledge of condition will improve Outcome: Adequate for Discharge Goal: Individualized Educational Video(s) Outcome: Adequate for Discharge Goal: Individualized Newborn Educational Video(s) Outcome: Adequate for Discharge   Problem: Activity: Goal: Will verbalize the importance of balancing activity with adequate rest periods Outcome: Adequate for Discharge Goal: Ability to tolerate increased activity will improve Outcome: Adequate for Discharge   Problem: Coping: Goal: Ability to identify and utilize available resources and services will improve Outcome: Adequate for Discharge   Problem: Life Cycle: Goal: Chance of risk for complications during the postpartum period will decrease Outcome: Adequate for Discharge   Problem: Role Relationship: Goal: Ability to demonstrate positive interaction with newborn will improve Outcome: Adequate for Discharge   Problem: Skin Integrity: Goal: Demonstration of wound healing without infection will improve Outcome: Adequate for Discharge   

## 2023-03-16 NOTE — Progress Notes (Signed)
   03/16/23 1436  Departure Condition  Departure Condition Good  Mobility at [facility]  Patient/Caregiver Teaching Teach Back Method Used;Discharge instructions reviewed;Prescriptions reviewed;Follow-up care reviewed;Pain management discussed;Patient/caregiver verbalized understanding;Educated about hypertension in pregnancy;Medications discussed  Departure Mode With family;With significant other  Was procedural sedation performed on this patient during this visit? No   Patient alert and oriented x4, VS and pain stable.

## 2023-03-16 NOTE — Discharge Summary (Signed)
Postpartum Discharge Summary  Date of Service updated 03/16/23     Patient Name: Melinda Nichols DOB: 09-Sep-1980 MRN: [id number]  Date of admission: 03/13/2023 Delivery date:   Rabekah, [place] [[id number]]  03/13/2023    Tiki, Bachrach [[id number]]  03/13/2023 Delivering provider:    Ikeisha, Rowbottom [[id number]]  LOWE, DAVID    Natile, Selinsky [[id number]]  LOWE, DAVID Date of discharge: 03/16/2023  Admitting diagnosis: Previous cesarean section [K44.010] Cesarean delivery delivered [O82] Intrauterine pregnancy: [redacted]w[redacted]d     Secondary diagnosis:  Principal Problem:   Previous cesarean section Active Problems:   Cesarean delivery delivered  Additional problems: PPH    Discharge diagnosis: Preterm Pregnancy Delivered                                              Post partum procedures:curettage  Augmentation: N/A Complications: Hemorrhage>106mL  Hospital course: Sceduled C/S   42 y.o. yo [id number] at [redacted]w[redacted]d was admitted to the hospital 03/13/2023 for scheduled cesarean section with the following indication:Elective Repeat and IUGR, ICP .Delivery details are as follows:  Membrane Rupture Time/Date:    Allana, [place]resse [[id number]]  10:46 AM    Lujean Amel [[id number]]  10:46 AM,   Janaiya, Fishell [[id number]]  03/13/2023    Tenasia, Tomanek [[id number]]  03/13/2023  Delivery Method:   Carmelina Paddock [[id number]]  C-Section, Low Transverse    Lillyana, Soden [[id number]]  C-Section, Low Transverse Operative Delivery:N/A Details of operation can be found in separate operative note.  Patient had a postpartum course complicated by PPH requiring Currette, uterotonics, and Jada attempt. She also was given 4 u PRBCs, 2u PLTs and 1u cryo. Total EBL was 2406cc.  She is ambulating, tolerating a regular diet, passing flatus, and urinating well. Patient is discharged home in stable condition on  03/16/23        Newborn Data: Birth date:   Annikah, Delacueva  [[id number]]  03/13/2023    Hazleigh, Manwaring [[id number]]  03/13/2023 Birth time:   Deavian, Sawada [[id number]]  10:42 AM    Lujean Amel [[id number]]  10:45 AM Gender:   Haily, Houlahan [[id number]]  Female    Mawa, Pitzer [[id number]]  Female Living status:   Kierra, Loh [[id number]]  Living    Shunna, [place] [[id number]]  Living Apgars:   Zaleyah, Rookstool [[id number]]  [address] [place] [[id number]]  6 ,   PYPPJ, KDTOI ZTIW [[id number]]  [address] [place] [[id number]]  9  Weight:   Marabelle, Maltos [[id number]]  2440 g    Topacio, Whitely [[id number]]  2260 g    Magnesium Sulfate received: No Rhophylac:No MMR:N/A T-DaP:Given prenatally Flu: N/A Transfusion:Yes  Physical exam  Vitals:   03/15/23 1318 03/15/23 1522 03/15/23 2300 03/16/23 0001  BP: 112/63 111/71 113/73 115/74  Pulse: 64 62 (!) 53 (!) 54  Resp: 18 17  18   Temp: 98 F (36.7 C) 98 F (36.7 C) 97.6 F (36.4 C) 98 F (36.7 C)  TempSrc: Oral Oral Oral Oral  SpO2: 100% 99%    Weight:      Height:       General: alert and cooperative Lochia: appropriate Uterine Fundus: firm Incision: Healing well with no significant drainage, No significant erythema, Dressing is  clean, dry, and intact DVT Evaluation: No evidence of DVT seen on physical exam. Labs: Lab Results  Component Value Date   WBC 11.1 (H) 03/15/2023   HGB 9.4 (L) 03/15/2023   HCT 27.8 (L) 03/15/2023   MCV 86.6 03/15/2023   PLT 171 03/15/2023      Latest Ref Rng & Units 03/13/2023    4:13 PM  CMP  Sodium 135 - 145 mmol/L 137   Potassium 3.5 - 5.1 mmol/L 4.1    Edinburgh Score:     No data to display            After visit meds:  Allergies as of 03/16/2023   No Known Allergies      Medication List     STOP taking these medications    omeprazole 20 MG tablet Commonly known as: PRILOSEC OTC       TAKE these medications    docusate sodium 100 MG capsule Commonly  known as: Colace Take 1 capsule (100 mg total) by mouth 2 (two) times daily.   ferrous sulfate 325 (65 FE) MG EC tablet Take 1 tablet (325 mg total) by mouth 2 (two) times daily.   ibuprofen 600 MG tablet Commonly known as: ADVIL Take 1 tablet (600 mg total) by mouth every 6 (six) hours as needed.   oxyCODONE 5 MG immediate release tablet Commonly known as: Oxy IR/ROXICODONE Take 1 tablet (5 mg total) by mouth every 4 (four) hours as needed for severe pain.   Pepcid 20 MG tablet Generic drug: famotidine Take 20 mg by mouth daily as needed for heartburn or indigestion.   prenatal multivitamin Tabs tablet Take 1 tablet by mouth daily at 12 noon.   TYLENOL PO Take 2 tablets by mouth every 6 (six) hours as needed (Headache and back ache).         Discharge home in stable condition Infant Feeding: Bottle and Breast Infant Disposition:rooming in Discharge instruction: per After Visit Summary and Postpartum booklet. Activity: Advance as tolerated. Pelvic rest for 6 weeks.  Diet: routine diet Anticipated Birth Control: Unsure Postpartum Appointment:6 weeks Additional Postpartum F/U:  None Future Appointments:No future appointments. Follow up Visit:      03/16/2023 Ranae Pila, MD

## 2023-03-19 ENCOUNTER — Encounter (HOSPITAL_COMMUNITY): Payer: Self-pay | Admitting: Obstetrics and Gynecology

## 2023-04-10 ENCOUNTER — Telehealth (HOSPITAL_COMMUNITY): Payer: Self-pay

## 2023-04-10 NOTE — Telephone Encounter (Signed)
04/10/2023 1334  Name: Aika Depies MRN: [id number] DOB: 1980-11-10  Reason for Call:  Transition of Care Hospital Discharge Call  Contact Status: Patient Contact Status: Message  Language assistant needed: Interpreter Mode: Interpreter Not Needed        Follow-Up Questions:    Inocente Salles Postnatal Depression Scale:  In the Past 7 Days:    PHQ2-9 Depression Scale:     Discharge Follow-up:    Post-discharge interventions: NA  Signature  Signe Colt

## 2023-04-30 ENCOUNTER — Encounter: Payer: Self-pay | Admitting: Family Medicine

## 2023-06-11 ENCOUNTER — Encounter: Payer: Self-pay | Admitting: Family Medicine

## 2023-06-27 ENCOUNTER — Other Ambulatory Visit: Payer: Self-pay

## 2023-08-03 ENCOUNTER — Ambulatory Visit: Payer: 59 | Admitting: Family Medicine

## 2023-08-03 ENCOUNTER — Encounter: Payer: Self-pay | Admitting: Family Medicine

## 2023-08-03 VITALS — BP 112/72 | HR 76 | Temp 98.0°F | Resp 16 | Ht 61.0 in | Wt 119.0 lb

## 2023-08-03 DIAGNOSIS — D509 Iron deficiency anemia, unspecified: Secondary | ICD-10-CM | POA: Diagnosis not present

## 2023-08-03 DIAGNOSIS — Z23 Encounter for immunization: Secondary | ICD-10-CM | POA: Diagnosis not present

## 2023-08-03 DIAGNOSIS — R5383 Other fatigue: Secondary | ICD-10-CM

## 2023-08-03 LAB — IBC + FERRITIN
Ferritin: 8.2 ng/mL — ABNORMAL LOW (ref 10.0–291.0)
Iron: 51 ug/dL (ref 42–145)
Saturation Ratios: 10.4 % — ABNORMAL LOW (ref 20.0–50.0)
TIBC: 488.6 ug/dL — ABNORMAL HIGH (ref 250.0–450.0)
Transferrin: 349 mg/dL (ref 212.0–360.0)

## 2023-08-03 LAB — COMPREHENSIVE METABOLIC PANEL
ALT: 18 U/L (ref 0–35)
AST: 22 U/L (ref 0–37)
Albumin: 4.3 g/dL (ref 3.5–5.2)
Alkaline Phosphatase: 83 U/L (ref 39–117)
BUN: 13 mg/dL (ref 6–23)
CO2: 25 meq/L (ref 19–32)
Calcium: 9.4 mg/dL (ref 8.4–10.5)
Chloride: 103 meq/L (ref 96–112)
Creatinine, Ser: 0.65 mg/dL (ref 0.40–1.20)
GFR: 107.43 mL/min (ref >60.00)
Glucose, Bld: 86 mg/dL (ref 70–99)
Potassium: 4.5 meq/L (ref 3.5–5.1)
Sodium: 136 meq/L (ref 135–145)
Total Bilirubin: 0.4 mg/dL (ref 0.2–1.2)
Total Protein: 7.2 g/dL (ref 6.0–8.3)

## 2023-08-03 LAB — CBC
HCT: 35.7 % — ABNORMAL LOW (ref 36.0–46.0)
Hemoglobin: 11.7 g/dL — ABNORMAL LOW (ref 12.0–15.0)
MCHC: 32.9 g/dL (ref 30.0–36.0)
MCV: 90.7 fL (ref 78.0–100.0)
Platelets: 342 10*3/uL (ref 150.0–400.0)
RBC: 3.94 Mil/uL (ref 3.87–5.11)
RDW: 13.6 % (ref 11.5–15.5)
WBC: 6.6 10*3/uL (ref 4.0–10.5)

## 2023-08-03 LAB — TSH: TSH: 1.31 u[IU]/mL (ref 0.35–5.50)

## 2023-08-03 LAB — VITAMIN D 25 HYDROXY (VIT D DEFICIENCY, FRACTURES): VITD: 28.46 ng/mL — ABNORMAL LOW (ref 30.00–100.00)

## 2023-08-03 NOTE — Progress Notes (Signed)
 Chief Complaint  Patient presents with   Follow-up    Follow up     Subjective: Patient is a 43 y.o. female here for follow-up.  Patient has a history of iron  deficiency anemia.  She received many iron  infusions prior to her pregnancy.  She gave birth on 8/20.  She has not had any infusions or laboratory monitoring since then.  She has fatigue but does not know if it is related to that or raising infant twins.  No bleeding, excessive bruising, antiplatelet/anticoagulant medication.  She is taking ferrous sulfate  once daily.  Past Medical History:  Diagnosis Date   Anemia    Blood transfusion without reported diagnosis    Cholestasis during pregnancy    Endometriosis    GAD (generalized anxiety disorder)    GERD (gastroesophageal reflux disease)    Iron  deficiency anemia 03/23/2021   Pain of left calf 03/23/2021    Objective: BP 112/72   Pulse 76   Temp 98 F (36.7 C) (Oral)   Resp 16   Ht 5' 1 (1.549 m)   Wt 119 lb (54 kg)   SpO2 99%   BMI 22.48 kg/m  General: Awake, appears stated age Skin: No icterus, obvious bruising Heart: RRR, no LE edema Lungs: CTAB, no rales, wheezes or rhonchi. No accessory muscle use Psych: Age appropriate judgment and insight, normal affect and mood  Assessment and Plan: Iron  deficiency anemia, unspecified iron  deficiency anemia type - Plan: CBC, IBC + Ferritin  Fatigue, unspecified type - Plan: VITAMIN D  25 Hydroxy (Vit-D Deficiency, Fractures), TSH, Comprehensive metabolic panel  Need for influenza vaccination - Plan: Flu vaccine trivalent PF, 6mos and older(Flulaval,Afluria,Fluarix,Fluzone)  Chronic, unsure if controlled.  Check above labs.  For now, continue ferrous sulfate  325 mg daily.  May need to increase this or get her back in with hematology. Check above labs. Flu shot today. The patient voiced understanding and agreement to the plan.  Mabel [place], DO 08/03/23  9:51 AM

## 2023-08-03 NOTE — Patient Instructions (Signed)
 Give [place] 2-3 business days to get the results of your labs back.   Continue oral iron for now.   Let us know if you need anything.

## 2023-08-04 ENCOUNTER — Encounter: Payer: Self-pay | Admitting: Family Medicine

## 2023-08-04 DIAGNOSIS — D509 Iron deficiency anemia, unspecified: Secondary | ICD-10-CM

## 2023-08-06 ENCOUNTER — Other Ambulatory Visit: Payer: Self-pay

## 2023-08-06 DIAGNOSIS — E559 Vitamin D deficiency, unspecified: Secondary | ICD-10-CM

## 2023-08-06 DIAGNOSIS — R5383 Other fatigue: Secondary | ICD-10-CM

## 2023-08-06 NOTE — Addendum Note (Signed)
 Addended by: Kathi Ludwig on: 08/06/2023 07:36 AM   Modules accepted: Orders

## 2023-08-27 ENCOUNTER — Ambulatory Visit
Admission: RE | Admit: 2023-08-27 | Discharge: 2023-08-27 | Disposition: A | Discharge status: Home / Self Care | Payer: 59 | Source: Ambulatory Visit | Attending: Sports Medicine

## 2023-08-27 ENCOUNTER — Ambulatory Visit: Payer: 59 | Admitting: Sports Medicine

## 2023-08-27 VITALS — BP 120/80 | HR 64 | Ht 61.0 in | Wt 118.0 lb

## 2023-08-27 DIAGNOSIS — M79672 Pain in left foot: Secondary | ICD-10-CM

## 2023-08-27 MED ORDER — MELOXICAM 15 MG PO TABS: 15.0000 mg | ORAL_TABLET | Freq: Every day | ORAL | 0 refills | Status: AC

## 2023-08-27 NOTE — Patient Instructions (Signed)
Xrays at [facility] meloxicam 15 mg daily x2 weeks.  If still having pain after 2 weeks, complete 3rd-week of NSAID. May use remaining NSAID as needed once daily for pain control.  Do not to use additional over-the-counter NSAIDs (ibuprofen, naproxen, Advil, Aleve) while taking prescription NSAIDs.  May use Tylenol [id number] mg 2 to 3 times a day for breakthrough pain. We will call you with results 3 week follow up

## 2023-08-27 NOTE — Progress Notes (Signed)
Melinda Nichols D.[facility] [address] [place] [address] Phone: [phone]   Assessment and Plan:     1. Left foot pain -Acute, initial visit - Inversion ankle injury 2 weeks ago leading to tenderness along fifth metatarsal shaft and base. - X-ray obtained after clinic.  I called and discussed with patient my interpretation: No displaced fracture.  Opaque line through metatarsal base at area of patient's maximal TTP.  Indeterminate fracture.  Will wait for official radiology review - Recommend weightbearing as tolerated in postop/fracture shoe - Start meloxicam 15 mg daily x2 weeks.  If still having pain after 2 weeks, complete 3rd-week of NSAID. May use remaining NSAID as needed once daily for pain control.  Do not to use additional over-the-counter NSAIDs (ibuprofen, naproxen, Advil, Aleve) while taking prescription NSAIDs.  May use Tylenol [phone] mg 2 to 3 times a day for breakthrough pain.    Pertinent previous records reviewed include none  Follow Up: 3 to 4 weeks for reevaluation.  Would repeat left foot x-ray.   Subjective:   I, Melinda Nichols, am serving as a Neurosurgeon for Doctor Richardean Sale  Chief Complaint: ankle pain  HPI:   08/27/23 Patient is a 43 year old female with ankle pain. Patient states left ankle pain. Pain is located to the sides of her foot. She rolled her foot more than her ankle. Lateral bruising, antalgic gait. 2 weeks ago the incident happened. Constant pain even at rest. She is not able to do ADLs. Tylenol and ibu for the pain , intermittent relief. No radiating pain. No numbness or tingling    Relevant Historical Information: None pertinent  Additional pertinent review of systems negative.   Current Outpatient Medications:    Acetaminophen (TYLENOL PO), Take 2 tablets by mouth every 6 (six) hours as needed (Headache and back ache)., Disp: , Rfl:    docusate sodium (COLACE) 100 MG capsule, Take 1  capsule (100 mg total) by mouth 2 (two) times daily., Disp: 100 capsule, Rfl: 1   famotidine (PEPCID) 20 MG tablet, Take 20 mg by mouth daily as needed for heartburn or indigestion., Disp: , Rfl:    ferrous sulfate 325 (65 FE) MG EC tablet, Take 1 tablet (325 mg total) by mouth 2 (two) times daily., Disp: 60 tablet, Rfl: 2   ibuprofen (ADVIL) 600 MG tablet, Take 1 tablet (600 mg total) by mouth every 6 (six) hours as needed., Disp: 30 tablet, Rfl: 0   meloxicam (MOBIC) 15 MG tablet, Take 1 tablet (15 mg total) by mouth daily., Disp: 30 tablet, Rfl: 0   oxyCODONE (OXY IR/ROXICODONE) 5 MG immediate release tablet, Take 1 tablet (5 mg total) by mouth every 4 (four) hours as needed for severe pain., Disp: 15 tablet, Rfl: 0   Prenatal Vit-Fe Fumarate-FA (PRENATAL MULTIVITAMIN) TABS tablet, Take 1 tablet by mouth daily at 12 noon., Disp: , Rfl:    Objective:     Vitals:   08/27/23 1410  BP: 120/80  Pulse: 64  SpO2: 100%  Weight: 118 lb (53.5 kg)  Height: 5\' 1"  (1.549 m)      Body mass index is 22.3 kg/m.    Physical Exam:    Gen: Appears well, nad, nontoxic and pleasant Psych: Alert and oriented, appropriate mood and affect Neuro: sensation intact, strength is 5/5 with df/pf/inv/ev, muscle tone wnl Skin: no susupicious lesions or rashes  Left foot/ankle:  No deformity, no swelling or effusion TTP base and shaft  of fifth NTTP over fibular head, lat mal, medial mal, achilles, navicular,  ATFL, CFL, deltoid, calcaneous or midfoot ROM DF 30, PF 45, inv/ev intact Negative ant drawer, talar tilt, rotation test, squeeze test. Neg thompson No pain with resisted inversion or eversion    Electronically signed by:  Melinda Nichols [facility] 3:18 PM 08/27/23

## 2023-09-10 ENCOUNTER — Encounter: Payer: Self-pay | Admitting: Sports Medicine

## 2023-09-14 NOTE — Progress Notes (Deleted)
    Aleen Sells D.[facility] [address] [place] [address] Phone: [phone]   Assessment and Plan:     There are no diagnoses linked to this encounter.  ***   Pertinent previous records reviewed include ***    Follow Up: ***     Subjective:   I, Calixto Pavel, am serving as a Neurosurgeon for Doctor Richardean Sale   Chief Complaint: ankle pain   HPI:    08/27/23 Patient is a 43 year old female with ankle pain. Patient states left ankle pain. Pain is located to the sides of her foot. She rolled her foot more than her ankle. Lateral bruising, antalgic gait. 2 weeks ago the incident happened. Constant pain even at rest. She is not able to do ADLs. Tylenol and ibu for the pain , intermittent relief. No radiating pain. No numbness or tingling    09/17/2023 Patient states   Relevant Historical Information: None pertinent   Additional pertinent review of systems negative.   Current Outpatient Medications:    Acetaminophen (TYLENOL PO), Take 2 tablets by mouth every 6 (six) hours as needed (Headache and back ache)., Disp: , Rfl:    docusate sodium (COLACE) 100 MG capsule, Take 1 capsule (100 mg total) by mouth 2 (two) times daily., Disp: 100 capsule, Rfl: 1   famotidine (PEPCID) 20 MG tablet, Take 20 mg by mouth daily as needed for heartburn or indigestion., Disp: , Rfl:    ferrous sulfate 325 (65 FE) MG EC tablet, Take 1 tablet (325 mg total) by mouth 2 (two) times daily., Disp: 60 tablet, Rfl: 2   ibuprofen (ADVIL) 600 MG tablet, Take 1 tablet (600 mg total) by mouth every 6 (six) hours as needed., Disp: 30 tablet, Rfl: 0   meloxicam (MOBIC) 15 MG tablet, Take 1 tablet (15 mg total) by mouth daily., Disp: 30 tablet, Rfl: 0   oxyCODONE (OXY IR/ROXICODONE) 5 MG immediate release tablet, Take 1 tablet (5 mg total) by mouth every 4 (four) hours as needed for severe pain., Disp: 15 tablet, Rfl: 0   Prenatal Vit-Fe Fumarate-FA (PRENATAL  MULTIVITAMIN) TABS tablet, Take 1 tablet by mouth daily at 12 noon., Disp: , Rfl:    Objective:     There were no vitals filed for this visit.    There is no height or weight on file to calculate BMI.    Physical Exam:    ***   Electronically signed by:  Aleen Sells [facility] 7:22 AM 09/14/23

## 2023-09-17 ENCOUNTER — Ambulatory Visit: Payer: 59 | Admitting: Sports Medicine

## 2023-09-23 ENCOUNTER — Other Ambulatory Visit: Payer: Self-pay | Admitting: Sports Medicine

## 2023-12-05 ENCOUNTER — Encounter: Payer: Self-pay | Admitting: Family Medicine

## 2023-12-05 ENCOUNTER — Ambulatory Visit: Admitting: Family Medicine

## 2023-12-05 VITALS — BP 116/74 | HR 72 | Temp 98.0°F | Resp 16 | Ht 61.0 in | Wt 110.6 lb

## 2023-12-05 DIAGNOSIS — H6502 Acute serous otitis media, left ear: Secondary | ICD-10-CM | POA: Diagnosis not present

## 2023-12-05 DIAGNOSIS — D509 Iron deficiency anemia, unspecified: Secondary | ICD-10-CM

## 2023-12-05 MED ORDER — PREDNISONE 20 MG PO TABS
40.0000 mg | ORAL_TABLET | Freq: Every day | ORAL | 0 refills | Status: AC
Start: 2023-12-05 — End: 2023-12-10

## 2023-12-05 MED ORDER — FERROUS SULFATE 325 (65 FE) MG PO TBEC: 325.0000 mg | DELAYED_RELEASE_TABLET | Freq: Every day | ORAL | Status: AC

## 2023-12-05 MED ORDER — AMOXICILLIN 875 MG PO TABS
875.0000 mg | ORAL_TABLET | Freq: Two times a day (BID) | ORAL | 0 refills | Status: DC
Start: 2023-12-05 — End: 2023-12-12

## 2023-12-05 MED ORDER — FLUCONAZOLE 150 MG PO TABS: ORAL_TABLET | ORAL | 0 refills | Status: AC

## 2023-12-05 NOTE — Progress Notes (Signed)
 Chief Complaint  Patient presents with   Cough    Melinda Nichols here for URI complaints.  Duration: 4 days  Associated symptoms: sinus congestion, rhinorrhea, ear fullness, ear pain, ear drainage, and hearing loss, cough Denies: sinus pain, itchy watery eyes, sore throat, wheezing, shortness of breath, myalgia, and fevers Treatment to date: Ibuprofen  Sick contacts: Yes- 35 mo old twins  Past Medical History:  Diagnosis Date   Anemia    Blood transfusion without reported diagnosis    Cholestasis during pregnancy    Endometriosis    GAD (generalized anxiety disorder)    GERD (gastroesophageal reflux disease)    Iron  deficiency anemia 03/23/2021   Pain of left calf 03/23/2021    Objective BP 116/74 (BP Location: Left Arm, Patient Position: Sitting)   Pulse 72   Temp 98 F (36.7 C) (Oral)   Resp 16   Ht 5\' 1"  (1.549 m)   Wt 110 lb 9.6 oz (50.2 kg)   SpO2 99%   BMI 20.90 kg/m  General: Awake, alert, appears stated age HEENT: AT, [place], ears patent b/l and TM on the right neg, TM on the left retracted, with serous fluid behind it, and erythematous; nares patent w/o discharge, pharynx pink and without exudates, MMM, no sinus ttp b/l Neck: No masses or asymmetry Heart: RRR Lungs: CTAB, no accessory muscle use Psych: Age appropriate judgment and insight, normal mood and affect  Acute serous otitis media of left ear, recurrence not specified - Plan: predniSONE (DELTASONE) 20 MG tablet, amoxicillin  (AMOXIL ) 875 MG tablet  Iron  deficiency anemia, unspecified iron  deficiency anemia type - Plan: IBC + Ferritin, CBC w/Diff  5-day prednisone burst 40 mg daily to open up the eustachian tube.  7 days of amoxicillin  for the ear infection.  She will send me a message if she starts having drainage.  Continue to push fluids, practice good hand hygiene, cover mouth when coughing. F/u prn. If starting to experience fevers, shaking, or shortness of breath, seek immediate care. Follow-up on  iron  deficiency anemia as above today.  Drop down iron  dosing to daily. Pt voiced understanding and agreement to the plan.  Shellie Dials [place], DO 12/05/23 3:13 PM

## 2023-12-05 NOTE — Patient Instructions (Addendum)
 OK to take Tylenol  1000 mg (2 extra strength tabs) or 975 mg (3 regular strength tabs) every 6 hours as needed.  Continue to push fluids, practice good hand hygiene, and cover your mouth if you cough.  If you start having fevers, shaking or shortness of breath, seek immediate care.  Send me a message if you have more drainage from your ear.   Let us  know if you need anything.

## 2023-12-06 ENCOUNTER — Ambulatory Visit: Payer: Self-pay | Admitting: Family Medicine

## 2023-12-06 LAB — CBC WITH DIFFERENTIAL/PLATELET
Basophils Absolute: 0.1 10*3/uL (ref 0.0–0.1)
Basophils Relative: 1 % (ref 0.0–3.0)
Eosinophils Absolute: 0.2 10*3/uL (ref 0.0–0.7)
Eosinophils Relative: 2.8 % (ref 0.0–5.0)
HCT: 36 % (ref 36.0–46.0)
Hemoglobin: 11.9 g/dL — ABNORMAL LOW (ref 12.0–15.0)
Lymphocytes Relative: 26.4 % (ref 12.0–46.0)
Lymphs Abs: 1.8 10*3/uL (ref 0.7–4.0)
MCHC: 33 g/dL (ref 30.0–36.0)
MCV: 87.6 fl (ref 78.0–100.0)
Monocytes Absolute: 0.7 10*3/uL (ref 0.1–1.0)
Monocytes Relative: 9.7 % (ref 3.0–12.0)
Neutro Abs: 4.1 10*3/uL (ref 1.4–7.7)
Neutrophils Relative %: 60.1 % (ref 43.0–77.0)
Platelets: 231 10*3/uL (ref 150.0–400.0)
RBC: 4.11 Mil/uL (ref 3.87–5.11)
RDW: 14.3 % (ref 11.5–15.5)
WBC: 6.8 10*3/uL (ref 4.0–10.5)

## 2023-12-06 LAB — IBC + FERRITIN
Ferritin: 53.9 ng/mL (ref 10.0–291.0)
Iron: 18 ug/dL — ABNORMAL LOW (ref 42–145)
Saturation Ratios: 5 % — ABNORMAL LOW (ref 20.0–50.0)
TIBC: 361.2 ug/dL (ref 250.0–450.0)
Transferrin: 258 mg/dL (ref 212.0–360.0)

## 2023-12-06 NOTE — Addendum Note (Signed)
 Addended by: Czar Ysaguirre D on: 12/06/2023 03:02 PM   Modules accepted: Orders

## 2023-12-07 ENCOUNTER — Other Ambulatory Visit: Payer: Self-pay | Admitting: Family Medicine

## 2023-12-07 MED ORDER — AMOXICILLIN-POT CLAVULANATE 875-125 MG PO TABS: 1.0000 | ORAL_TABLET | Freq: Two times a day (BID) | ORAL | 0 refills | Status: AC

## 2023-12-07 MED ORDER — CIPROFLOXACIN-DEXAMETHASONE 0.3-0.1 % OT SUSP: 4.0000 [drp] | Freq: Two times a day (BID) | OTIC | 0 refills | Status: AC

## 2023-12-14 ENCOUNTER — Other Ambulatory Visit: Payer: Self-pay

## 2023-12-14 DIAGNOSIS — H9209 Otalgia, unspecified ear: Secondary | ICD-10-CM

## 2024-01-09 LAB — HM MAMMOGRAPHY

## 2024-01-15 ENCOUNTER — Encounter: Payer: Self-pay | Admitting: Family Medicine
